# Patient Record
Sex: Female | Born: 1970 | Hispanic: No | Marital: Single | Smoking: Former smoker
Health system: Southern US, Community
[De-identification: ages and names within clinical notes are randomized; demographics above are authoritative.]

## PROBLEM LIST (undated history)

## (undated) DIAGNOSIS — F419 Anxiety disorder, unspecified: Secondary | ICD-10-CM

## (undated) DIAGNOSIS — I10 Essential (primary) hypertension: Secondary | ICD-10-CM

## (undated) DIAGNOSIS — F329 Major depressive disorder, single episode, unspecified: Secondary | ICD-10-CM

## (undated) DIAGNOSIS — F319 Bipolar disorder, unspecified: Secondary | ICD-10-CM

## (undated) DIAGNOSIS — F32A Depression, unspecified: Secondary | ICD-10-CM

## (undated) DIAGNOSIS — R569 Unspecified convulsions: Secondary | ICD-10-CM

## (undated) DIAGNOSIS — F209 Schizophrenia, unspecified: Secondary | ICD-10-CM

## (undated) HISTORY — PX: ABLATION: SHX5711

---

## 2017-01-24 ENCOUNTER — Encounter (HOSPITAL_COMMUNITY): Payer: Self-pay | Admitting: Emergency Medicine

## 2017-01-24 ENCOUNTER — Emergency Department (HOSPITAL_COMMUNITY)
Admission: EM | Admit: 2017-01-24 | Discharge: 2017-01-25 | Disposition: A | Discharge status: Home / Self Care | Payer: Medicaid Other | Attending: Emergency Medicine | Admitting: Emergency Medicine

## 2017-01-24 DIAGNOSIS — I10 Essential (primary) hypertension: Secondary | ICD-10-CM | POA: Insufficient documentation

## 2017-01-24 DIAGNOSIS — Z9114 Patient's other noncompliance with medication regimen: Secondary | ICD-10-CM | POA: Diagnosis not present

## 2017-01-24 DIAGNOSIS — Z87891 Personal history of nicotine dependence: Secondary | ICD-10-CM | POA: Diagnosis not present

## 2017-01-24 DIAGNOSIS — R569 Unspecified convulsions: Secondary | ICD-10-CM | POA: Insufficient documentation

## 2017-01-24 HISTORY — DX: Unspecified convulsions: R56.9

## 2017-01-24 HISTORY — DX: Essential (primary) hypertension: I10

## 2017-01-24 LAB — I-STAT CHEM 8, ED
BUN: 13 mg/dL (ref 6–20)
CALCIUM ION: 1.13 mmol/L — AB (ref 1.15–1.40)
CHLORIDE: 107 mmol/L (ref 101–111)
Creatinine, Ser: 0.8 mg/dL (ref 0.44–1.00)
GLUCOSE: 89 mg/dL (ref 65–99)
HCT: 39 % (ref 36.0–46.0)
Hemoglobin: 13.3 g/dL (ref 12.0–15.0)
POTASSIUM: 4 mmol/L (ref 3.5–5.1)
Sodium: 141 mmol/L (ref 135–145)
TCO2: 23 mmol/L (ref 0–100)

## 2017-01-24 LAB — PHENYTOIN LEVEL, TOTAL

## 2017-01-24 LAB — CBG MONITORING, ED: GLUCOSE-CAPILLARY: 108 mg/dL — AB (ref 65–99)

## 2017-01-24 MED ORDER — IBUPROFEN 200 MG PO TABS
600.0000 mg | ORAL_TABLET | Freq: Once | ORAL | Status: AC
  Administered 2017-01-24: 600 mg via ORAL
  Filled 2017-01-24: qty 3

## 2017-01-24 MED ORDER — PHENYTOIN SODIUM EXTENDED 100 MG PO CAPS: 100.0000 mg | ORAL_CAPSULE | Freq: Two times a day (BID) | ORAL | 0 refills | Status: DC

## 2017-01-24 MED ORDER — SODIUM CHLORIDE 0.9 % IV SOLN
1000.0000 mg | Freq: Once | INTRAVENOUS | Status: AC
  Administered 2017-01-24: 1000 mg via INTRAVENOUS
  Filled 2017-01-24: qty 20

## 2017-01-24 MED ORDER — SODIUM CHLORIDE 0.9 % IV BOLUS (SEPSIS)
1000.0000 mL | Freq: Once | INTRAVENOUS | Status: AC
  Administered 2017-01-24: 1000 mL via INTRAVENOUS

## 2017-01-24 MED ORDER — LORAZEPAM 2 MG/ML IJ SOLN
INTRAMUSCULAR | Status: AC
  Administered 2017-01-24: 1 mg
  Filled 2017-01-24: qty 1

## 2017-01-24 NOTE — ED Notes (Signed)
Pt's gait weak and staggering, states that she is still 'groggy'.  Both RN Florentina AddisonKatie and I (RN Catering manager(Liza) do not feel comfortable sending pt home in bus.  Pt states she doesn't have money for a cab and no one can come get her.  [facility] will not take her d/t her ability to walk.  Discussed with charge Camelia Engerri, social work consult placed.  Pt moved to TCU for further evaluation.

## 2017-01-24 NOTE — ED Triage Notes (Signed)
Per [facility] pt ran out of seizure medication yesterday. Had seizure today lasting about 3 mins. Pt requesting refill til she can see PCP on Monday.

## 2017-01-24 NOTE — ED Notes (Signed)
Bed: WA02 Expected date:  Expected time:  Means of arrival:  Comments: Seizure, med refill

## 2017-01-24 NOTE — ED Notes (Signed)
RN heard monitor alarming for about 15 seconds then came into room to find pt unresponsive to voice or sternal rub and drooling excessively.  HR was in 140s and sats in upper 90s.  Called for help and started suction.  PA Misty StanleyLisa saw her, ordered 1 mg ativan and to start dilantin (already ordered and at bedside) immediately as well as fluids.  Estimated time of seizure 1 minute total, pt did not bite tongue or lose airway or harm herself in any way.  Pt currently alert and oriented, unaware that she had seizure, airway still intact, sats at 98%.  HR still about 130 after ativan started.  Seizure pads placed and PA alerted to continued elevated HR.

## 2017-01-24 NOTE — Discharge Instructions (Signed)
I have refilled your dilantin level.  It is important that you take this regularly. Follow-up with your doctor once you return home. Return here for new concerns.

## 2017-01-24 NOTE — ED Notes (Signed)
Pt currently A&O, states that she feels comfortable going home and would like a bus pass.  Only asking for pain medication for HA.  No other seizures have occurred.  HR back down around 100.  Md aware.

## 2017-01-24 NOTE — ED Provider Notes (Signed)
[facility]ENC[facility] DEPT Provider Note   CSN: [id number] Arrival date & time: 01/24/17  1754     History   Chief Complaint Chief Complaint  Patient presents with  . Seizures    HPI Katherine Morse is a 46 y.o. female.  The history is provided by the patient and medical records.  Seizures      46 year old female with history of hypertension and seizures, presenting to the ED after a seizure. Patient reports she is currently in [place] temporarily, [place]. States she ran out of her seizure medicine yesterday so has missed the last 3 doses. States she had a seizure today which lasted about 3 minutes. On arrival to ED, patient is back to baseline. States she feels fine. No recent illness or fever. She states she plans to return home within the next 30 days and would just like a refill of her seizure medication until she can return there. No other complaints at this time.  Patient takes dilantin 100mg  BID.  Past Medical History:  Diagnosis Date  . Hypertension   . Seizures (HCC)     There are no active problems to display for this patient.   Past Surgical History:  Procedure Laterality Date  . ABLATION    . CESAREAN SECTION      OB History    No data available       Home Medications    Prior to Admission medications   Not on File    Family History No family history on file.  Social History Social History  Substance Use Topics  . Smoking status: Former Games developermoker  . Smokeless tobacco: Not on file  . Alcohol use No     Allergies   Compazine [prochlorperazine edisylate]; Morphine and related; Penicillins; and Vistaril [hydroxyzine hcl]   Review of Systems Review of Systems  Neurological: Positive for seizures.  All other systems reviewed and are negative.    Physical Exam Updated Vital Signs BP 134/80 (BP Location: Right Arm)   Pulse 100   Temp 99.6 F (37.6 C) (Oral)   Resp 14   Ht 5\' 2"  (1.575 m)   SpO2 100%   Physical  Exam  Constitutional: She is oriented to person, place, and time. She appears well-developed and well-nourished.  Calm, cooperative, no distress  HENT:  Head: Normocephalic and atraumatic.  Mouth/Throat: Oropharynx is clear and moist.  Eyes: Conjunctivae and EOM are normal. Pupils are equal, round, and reactive to light.  Neck: Normal range of motion.  Cardiovascular: Normal rate, regular rhythm and normal heart sounds.   Pulmonary/Chest: Effort normal and breath sounds normal.  Abdominal: Soft. Bowel sounds are normal.  Musculoskeletal: Normal range of motion.  Neurological: She is alert and oriented to person, place, and time.  AAOx3, answering questions and following commands appropriately; equal strength UE and LE bilaterally; CN grossly intact; moves all extremities appropriately without ataxia; no focal neuro deficits or facial asymmetry appreciated  Skin: Skin is warm and dry.  Psychiatric: She has a normal mood and affect.  Nursing note and vitals reviewed.    ED Treatments / Results  Labs (all labs ordered are listed, but only abnormal results are displayed) Labs Reviewed  PHENYTOIN LEVEL, TOTAL - Abnormal; Notable for the following:       Result Value   Phenytoin Lvl <2.5 (*)    All other components within normal limits  CBG MONITORING, ED - Abnormal; Notable for the following:    Glucose-Capillary 108 (*)  All other components within normal limits  I-STAT CHEM 8, ED - Abnormal; Notable for the following:    Calcium, Ion 1.13 (*)    All other components within normal limits    EKG  EKG Interpretation None       Radiology No results found.  Procedures Procedures (including critical care time)  Medications Ordered in ED Medications - No data to display   Initial Impression / Assessment and Plan / ED Course  I have reviewed the triage vital signs and the nursing notes.  Pertinent labs & imaging results that were available during my care of the patient  were reviewed by me and considered in my medical decision making (see chart for details).  46 year old female here after seizure. She has history of same. Reports she ran out of her Dilantin yesterday. Currently in [place] visiting from [place]. Patient is awake, alert, appropriately oriented on arrival. She denies any current complaints. She is neurologically intact. We'll plan for labs, EKG. Glucose 108.  Labwork with normal electrolytes. Dilantin level is <2.5.  Suspect patient has been out of her medication longer than stated.  Will load with IV dilantin.  8:06 PM Called back to patient's room.  RN entered room to hang dilantin and found patient breathing heavily, rapid HR, foaming at mouth with recurrent seizure.  This was brief, lasted <1 minute.  Patient was given 1mg  ativan now.  Dilantin load infusing now.  IVFB given as well.  Recurrent seizure likely due medication non-compliance.  Will monitor here.  8:29 PM Patient back to baseline.  Currently on phone talking with family members.  Requesting food to eat.  Given meal and fluids.  9:58 PM Patient has been eating and drinking here.  No further seizure activity.  Neurologically intact.  She feels comfortable going home.  Will refill her dilantin for 1 month.  Encouraged to follow-up with PCP as soon as she returns to [place].  Discussed plan with patient, she acknowledged understanding and agreed with plan of care.  Return precautions given for new or worsening symptoms.  Final Clinical Impressions(s) / ED Diagnoses   Final diagnoses:  Seizure (HCC)  Non compliance w medication regimen    New Prescriptions New Prescriptions   PHENYTOIN (DILANTIN) 100 MG ER CAPSULE    Take 1 capsule (100 mg total) by mouth 2 (two) times daily.     Garlon Hatchet, PA-C 01/24/17 2219    Lorre Nick, MD 01/28/17 [phone]

## 2017-01-25 NOTE — Care Management Note (Signed)
Case Management Note  Patient Details  Name: Katherine Morse MRN: [id number] Date of Birth: 1971-07-01  Subjective/Objective:    Seizures                 Action/Plan: Discharge Planning: AVS reviewed: NCM spoke to pt and provided her with facesheet to take with her to pharmacy to have her medication filled. States she does not have a copy of her medicaid card. She will contact her pharmacy to have medications transferred to a pharmacy closer to [place] in [place].   Expected Discharge Date:  01/25/2017               Expected Discharge Plan:  Home/Self Care  In-House Referral:  NA  Discharge planning Services  CM Consult, Medication Assistance  Post Acute Care Choice:  NA Choice offered to:  NA  DME Arranged:  N/A DME Agency:  NA  HH Arranged:  NA HH Agency:  NA  Status of Service:  Completed, signed off  If discussed at Long Length of Stay Meetings, dates discussed:    Additional Comments:  Katherine Morse, Katherine Tift Ellen, RN 01/25/2017, 11:22 AM

## 2017-01-27 ENCOUNTER — Telehealth: Payer: Self-pay | Admitting: *Deleted

## 2017-02-05 ENCOUNTER — Encounter (HOSPITAL_COMMUNITY): Payer: Self-pay | Admitting: Family Medicine

## 2017-02-05 ENCOUNTER — Emergency Department (HOSPITAL_COMMUNITY)
Admission: EM | Admit: 2017-02-05 | Discharge: 2017-02-06 | Disposition: A | Discharge status: Other Acute Inpatient Hospital | Payer: Medicaid Other | Attending: Emergency Medicine | Admitting: Emergency Medicine

## 2017-02-05 DIAGNOSIS — Z87891 Personal history of nicotine dependence: Secondary | ICD-10-CM | POA: Insufficient documentation

## 2017-02-05 DIAGNOSIS — F32A Depression, unspecified: Secondary | ICD-10-CM

## 2017-02-05 DIAGNOSIS — I10 Essential (primary) hypertension: Secondary | ICD-10-CM | POA: Insufficient documentation

## 2017-02-05 DIAGNOSIS — Z79899 Other long term (current) drug therapy: Secondary | ICD-10-CM | POA: Insufficient documentation

## 2017-02-05 DIAGNOSIS — F251 Schizoaffective disorder, depressive type: Secondary | ICD-10-CM | POA: Insufficient documentation

## 2017-02-05 DIAGNOSIS — F329 Major depressive disorder, single episode, unspecified: Secondary | ICD-10-CM

## 2017-02-05 HISTORY — DX: Bipolar disorder, unspecified: F31.9

## 2017-02-05 HISTORY — DX: Anxiety disorder, unspecified: F41.9

## 2017-02-05 HISTORY — DX: Major depressive disorder, single episode, unspecified: F32.9

## 2017-02-05 HISTORY — DX: Depression, unspecified: F32.A

## 2017-02-05 HISTORY — DX: Schizophrenia, unspecified: F20.9

## 2017-02-05 LAB — SALICYLATE LEVEL: Salicylate Lvl: <7 mg/dL (ref 2.8–30.0)

## 2017-02-05 LAB — ETHANOL

## 2017-02-05 LAB — CBC
HCT: 37.4 % (ref 36.0–46.0)
Hemoglobin: 12.1 g/dL (ref 12.0–15.0)
MCH: 28.5 pg (ref 26.0–34.0)
MCHC: 32.4 g/dL (ref 30.0–36.0)
MCV: 88 fL (ref 78.0–100.0)
PLATELETS: 269 10*3/uL (ref 150–400)
RBC: 4.25 MIL/uL (ref 3.87–5.11)
RDW: 14.8 % (ref 11.5–15.5)
WBC: 9.3 10*3/uL (ref 4.0–10.5)

## 2017-02-05 LAB — COMPREHENSIVE METABOLIC PANEL
ALBUMIN: 4.4 g/dL (ref 3.5–5.0)
ALT: 15 U/L (ref 14–54)
ANION GAP: 7 (ref 5–15)
AST: 22 U/L (ref 15–41)
Alkaline Phosphatase: 86 U/L (ref 38–126)
BILIRUBIN TOTAL: 0.3 mg/dL (ref 0.3–1.2)
BUN: 17 mg/dL (ref 6–20)
CO2: 24 mmol/L (ref 22–32)
Calcium: 9.3 mg/dL (ref 8.9–10.3)
Chloride: 110 mmol/L (ref 101–111)
Creatinine, Ser: 0.7 mg/dL (ref 0.44–1.00)
GFR calc Af Amer: >60 mL/min (ref >60)
GFR calc non Af Amer: >60 mL/min (ref >60)
Glucose, Bld: 98 mg/dL (ref 65–99)
POTASSIUM: 3.7 mmol/L (ref 3.5–5.1)
Sodium: 141 mmol/L (ref 135–145)
TOTAL PROTEIN: 8.3 g/dL — AB (ref 6.5–8.1)

## 2017-02-05 LAB — ACETAMINOPHEN LEVEL
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
## 2017-02-06 ENCOUNTER — Inpatient Hospital Stay (HOSPITAL_COMMUNITY)
Admission: AD | Admit: 2017-02-06 | Discharge: 2017-02-09 | DRG: 885 | Disposition: A | Discharge status: Other Acute Inpatient Hospital | Payer: Medicaid Other | Source: Intra-hospital | Attending: Internal Medicine | Admitting: Internal Medicine

## 2017-02-06 ENCOUNTER — Encounter (HOSPITAL_COMMUNITY): Payer: Self-pay

## 2017-02-06 DIAGNOSIS — Z885 Allergy status to narcotic agent status: Secondary | ICD-10-CM | POA: Diagnosis not present

## 2017-02-06 DIAGNOSIS — Z818 Family history of other mental and behavioral disorders: Secondary | ICD-10-CM | POA: Diagnosis not present

## 2017-02-06 DIAGNOSIS — Z888 Allergy status to other drugs, medicaments and biological substances status: Secondary | ICD-10-CM

## 2017-02-06 DIAGNOSIS — Z880 Allergy status to penicillin: Secondary | ICD-10-CM

## 2017-02-06 DIAGNOSIS — Z8249 Family history of ischemic heart disease and other diseases of the circulatory system: Secondary | ICD-10-CM

## 2017-02-06 DIAGNOSIS — R45851 Suicidal ideations: Secondary | ICD-10-CM | POA: Diagnosis present

## 2017-02-06 DIAGNOSIS — Z79899 Other long term (current) drug therapy: Secondary | ICD-10-CM | POA: Diagnosis not present

## 2017-02-06 DIAGNOSIS — F419 Anxiety disorder, unspecified: Secondary | ICD-10-CM | POA: Diagnosis present

## 2017-02-06 DIAGNOSIS — R45850 Homicidal ideations: Secondary | ICD-10-CM | POA: Diagnosis present

## 2017-02-06 DIAGNOSIS — A4189 Other specified sepsis: Secondary | ICD-10-CM | POA: Diagnosis present

## 2017-02-06 DIAGNOSIS — F319 Bipolar disorder, unspecified: Secondary | ICD-10-CM | POA: Diagnosis present

## 2017-02-06 DIAGNOSIS — I10 Essential (primary) hypertension: Secondary | ICD-10-CM | POA: Diagnosis present

## 2017-02-06 DIAGNOSIS — R509 Fever, unspecified: Secondary | ICD-10-CM

## 2017-02-06 DIAGNOSIS — Z87891 Personal history of nicotine dependence: Secondary | ICD-10-CM

## 2017-02-06 DIAGNOSIS — R569 Unspecified convulsions: Secondary | ICD-10-CM

## 2017-02-06 DIAGNOSIS — F251 Schizoaffective disorder, depressive type: Principal | ICD-10-CM | POA: Diagnosis present

## 2017-02-06 DIAGNOSIS — J101 Influenza due to other identified influenza virus with other respiratory manifestations: Secondary | ICD-10-CM | POA: Diagnosis not present

## 2017-02-06 DIAGNOSIS — G40909 Epilepsy, unspecified, not intractable, without status epilepticus: Secondary | ICD-10-CM | POA: Diagnosis present

## 2017-02-06 DIAGNOSIS — Z79891 Long term (current) use of opiate analgesic: Secondary | ICD-10-CM | POA: Diagnosis not present

## 2017-02-06 DIAGNOSIS — A419 Sepsis, unspecified organism: Secondary | ICD-10-CM | POA: Diagnosis present

## 2017-02-06 LAB — RAPID URINE DRUG SCREEN, HOSP PERFORMED
AMPHETAMINES: NOT DETECTED
BENZODIAZEPINES: NOT DETECTED
Barbiturates: NOT DETECTED
Cocaine: NOT DETECTED
OPIATES: NOT DETECTED
TETRAHYDROCANNABINOL: NOT DETECTED

## 2017-02-06 MED ORDER — PHENYTOIN SODIUM EXTENDED 100 MG PO CAPS
100.0000 mg | ORAL_CAPSULE | Freq: Two times a day (BID) | ORAL | Status: DC
  Administered 2017-02-06 – 2017-02-08 (×5): 100 mg via ORAL
  Filled 2017-02-06 (×9): qty 1

## 2017-02-06 MED ORDER — GABAPENTIN 300 MG PO CAPS
300.0000 mg | ORAL_CAPSULE | Freq: Three times a day (TID) | ORAL | Status: DC
  Administered 2017-02-06: 300 mg via ORAL
  Filled 2017-02-06: qty 1

## 2017-02-06 MED ORDER — METOPROLOL TARTRATE 25 MG PO TABS
25.0000 mg | ORAL_TABLET | Freq: Three times a day (TID) | ORAL | Status: DC
Start: 2017-02-06 — End: 2017-02-06
  Administered 2017-02-06: 25 mg via ORAL
  Filled 2017-02-06: qty 1

## 2017-02-06 MED ORDER — GABAPENTIN 300 MG PO CAPS
300.0000 mg | ORAL_CAPSULE | Freq: Three times a day (TID) | ORAL | Status: DC
  Administered 2017-02-06 – 2017-02-08 (×7): 300 mg via ORAL
  Filled 2017-02-06 (×12): qty 1

## 2017-02-06 MED ORDER — DULOXETINE HCL 30 MG PO CPEP
60.0000 mg | ORAL_CAPSULE | Freq: Every day | ORAL | Status: DC
  Administered 2017-02-07 – 2017-02-08 (×2): 60 mg via ORAL
  Filled 2017-02-06 (×4): qty 1

## 2017-02-06 MED ORDER — ACETAMINOPHEN 325 MG PO TABS
650.0000 mg | ORAL_TABLET | Freq: Four times a day (QID) | ORAL | Status: DC | PRN
  Administered 2017-02-07 (×2): 650 mg via ORAL
  Filled 2017-02-06 (×2): qty 2

## 2017-02-06 MED ORDER — PHENYTOIN SODIUM EXTENDED 100 MG PO CAPS
100.0000 mg | ORAL_CAPSULE | Freq: Two times a day (BID) | ORAL | Status: DC
  Filled 2017-02-06 (×2): qty 1

## 2017-02-06 MED ORDER — DULOXETINE HCL 30 MG PO CPEP
60.0000 mg | ORAL_CAPSULE | Freq: Every day | ORAL | Status: DC
  Administered 2017-02-06: 60 mg via ORAL
  Filled 2017-02-06: qty 2

## 2017-02-06 MED ORDER — PHENYTOIN SODIUM EXTENDED 100 MG PO CAPS
100.0000 mg | ORAL_CAPSULE | Freq: Two times a day (BID) | ORAL | Status: DC
  Administered 2017-02-06: 100 mg via ORAL
  Filled 2017-02-06: qty 1

## 2017-02-06 MED ORDER — ALUM & MAG HYDROXIDE-SIMETH 200-200-20 MG/5ML PO SUSP: 30.0000 mL | ORAL | Status: DC | PRN

## 2017-02-06 MED ORDER — LORAZEPAM 1 MG PO TABS
1.0000 mg | ORAL_TABLET | Freq: Three times a day (TID) | ORAL | Status: DC | PRN
  Administered 2017-02-06 (×2): 1 mg via ORAL
  Filled 2017-02-06 (×2): qty 1

## 2017-02-06 MED ORDER — PANTOPRAZOLE SODIUM 40 MG PO TBEC
40.0000 mg | DELAYED_RELEASE_TABLET | Freq: Every day | ORAL | Status: DC
  Administered 2017-02-06: 40 mg via ORAL
  Filled 2017-02-06: qty 1

## 2017-02-06 MED ORDER — ACETAMINOPHEN 325 MG PO TABS: 650.0000 mg | ORAL_TABLET | ORAL | Status: DC | PRN

## 2017-02-06 MED ORDER — METOPROLOL TARTRATE 25 MG PO TABS
25.0000 mg | ORAL_TABLET | Freq: Three times a day (TID) | ORAL | Status: DC
  Filled 2017-02-06 (×2): qty 1

## 2017-02-06 MED ORDER — METOPROLOL TARTRATE 25 MG PO TABS
25.0000 mg | ORAL_TABLET | Freq: Three times a day (TID) | ORAL | Status: DC
  Administered 2017-02-06 – 2017-02-07 (×4): 25 mg via ORAL
  Filled 2017-02-06 (×12): qty 1

## 2017-02-06 MED ORDER — ONDANSETRON HCL 4 MG PO TABS: 4.0000 mg | ORAL_TABLET | Freq: Three times a day (TID) | ORAL | Status: DC | PRN

## 2017-02-06 MED ORDER — MAGNESIUM HYDROXIDE 400 MG/5ML PO SUSP
30.0000 mL | Freq: Every day | ORAL | Status: DC | PRN
  Administered 2017-02-07: 30 mL via ORAL
  Filled 2017-02-06: qty 30

## 2017-02-06 MED ORDER — GABAPENTIN 300 MG PO CAPS
300.0000 mg | ORAL_CAPSULE | Freq: Three times a day (TID) | ORAL | Status: DC
Start: 2017-02-06 — End: 2017-02-06
  Filled 2017-02-06 (×2): qty 1

## 2017-02-06 MED ORDER — PANTOPRAZOLE SODIUM 40 MG PO TBEC
40.0000 mg | DELAYED_RELEASE_TABLET | Freq: Every day | ORAL | Status: DC
  Administered 2017-02-07 – 2017-02-08 (×2): 40 mg via ORAL
  Filled 2017-02-06 (×4): qty 1

## 2017-02-06 MED ORDER — IBUPROFEN 200 MG PO TABS
600.0000 mg | ORAL_TABLET | Freq: Three times a day (TID) | ORAL | Status: DC | PRN
  Administered 2017-02-06: 600 mg via ORAL
  Filled 2017-02-06: qty 3
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
## 2017-02-06 NOTE — ED Notes (Signed)
[facility] on unit to transfer pt to [facility] Adult unit per MD order. Pt signed for personal property and property given to [facility] for transfer. Pt signed e-signature. Ambulatory off unit. 
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
## 2017-02-06 NOTE — ED Notes (Signed)
Pt taking a shower 

## 2017-02-06 NOTE — Progress Notes (Signed)
02/06/17 1402:  LRT went to pt room to offer activities, pt was sleep.  Caroll RancherMarjette Ayce Pietrzyk, LRT/CTRS
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
## 2017-02-06 NOTE — ED Notes (Signed)
[facility] with [facility]. Patient currently denies HI and AVH. Patient states " I need somewhere to stay I want them to find me long term placement". Plan of care discussed. Encouragement and support provided and safety maintain. Q 15 min safety checks remain in place and video monitoring.

## 2017-02-06 NOTE — ED Provider Notes (Signed)
[facility]-EMERGENCY DEPT Provider Note   CSN: [id number] Arrival date & time: 02/05/17  2154  By signing my name below, I, [name], attest that this documentation has been prepared under the direction and in the presence of Geoffery Lyons, MD. Electronically Signed: [name], ED Scribe. 02/06/17. 12:25 AM.  History   Chief Complaint Chief Complaint  Patient presents with  . Psychiatric Evaluation   The history is provided by the patient and medical records. No language interpreter was used.  Illness  This is a new problem. The current episode started more than 2 days ago. The problem occurs constantly. The problem has been gradually worsening. Nothing aggravates the symptoms. Nothing relieves the symptoms. She has tried nothing for the symptoms.    HPI Comments: Katherine Morse is a 46 y.o. female BIB [facility], with a h/o depression, anxiety, schizophrenia, and Bipolar 1 disorder, who presents to the Emergency Department complaining of gradually worsening depression with associated suicidal ideation without noted plan beginning several weeks ago. Pt attributes that her current symptoms have been precipitated by and exacerbated with increased stressors in her life, including financial issues, issues in her living situation, and thinking about her significant other's and son's deaths. She notes that d/t her living situation with her roommate that she has had intermittent homicidal ideation as well, but without noted plan. Pt was taken off of her daily psychotropic medications two months ago by her PCP. She has otherwise been non-compliant with her medications daily. No EtOH or illicit drug usage otherwise. No recent illnesses/infections. Pt denies fever, or any other associated symptoms.   Past Medical History:  Diagnosis Date  . Anxiety   . Bipolar 1 disorder (HCC)   . Depression   . Hypertension   . Schizophrenia (HCC)   . Seizures (HCC)    There are no active problems to  display for this patient.  Past Surgical History:  Procedure Laterality Date  . ABLATION    . CESAREAN SECTION     OB History    No data available     Home Medications    Prior to Admission medications   Medication Sig Start Date End Date Taking? Authorizing Provider  DULoxetine (CYMBALTA) 60 MG capsule Take 60 mg by mouth daily.   Yes Historical Provider, MD  gabapentin (NEURONTIN) 300 MG capsule Take 300 mg by mouth 3 (three) times daily.   Yes Historical Provider, MD  linaclotide (LINZESS) 145 MCG CAPS capsule Take 145 mcg by mouth daily before breakfast.   Yes Historical Provider, MD  metoprolol tartrate (LOPRESSOR) 25 MG tablet Take 25 mg by mouth 3 (three) times daily.   Yes Historical Provider, MD  omeprazole (PRILOSEC) 20 MG capsule Take 20 mg by mouth daily.   Yes Historical Provider, MD  phenytoin (DILANTIN) 100 MG ER capsule Take 1 capsule (100 mg total) by mouth 2 (two) times daily. 01/24/17  Yes Garlon Hatchet, PA-C   Family History History reviewed. No pertinent family history.  Social History Social History  Substance Use Topics  . Smoking status: Former Games developer  . Smokeless tobacco: Never Used  . Alcohol use No   Allergies   Compazine [prochlorperazine edisylate]; Morphine and related; Penicillins; and Vistaril [hydroxyzine hcl]  Review of Systems Review of Systems  Constitutional: Negative for fever.  Psychiatric/Behavioral: Positive for suicidal ideas.  All other systems reviewed and are negative.  Physical Exam Updated Vital Signs Temp 98 F (36.7 C) (Oral)   Ht 5\' 2"  (1.575 m)  Wt 257 lb 9 oz (116.8 kg)   BMI 47.11 kg/m   Physical Exam  Constitutional: She is oriented to person, place, and time. She appears well-developed and well-nourished. No distress.  HENT:  Head: Normocephalic and atraumatic.  Eyes: EOM are normal.  Neck: Normal range of motion.  Cardiovascular: Normal rate, regular rhythm and normal heart sounds.   Pulmonary/Chest:  Effort normal and breath sounds normal.  Abdominal: Soft. She exhibits no distension. There is no tenderness.  Musculoskeletal: Normal range of motion.  Neurological: She is alert and oriented to person, place, and time.  Skin: Skin is warm and dry.  Psychiatric: Her affect is labile. Her speech is tangential. She is agitated. Cognition and memory are normal. She expresses impulsivity and inappropriate judgment. She exhibits a depressed mood. She expresses homicidal ideation.  Nursing note and vitals reviewed.  ED Treatments / Results  COORDINATION OF CARE: 12:24 AM-Discussed next steps with pt. Pt verbalized understanding and is agreeable with the plan.   Labs (all labs ordered are listed, but only abnormal results are displayed) Labs Reviewed  COMPREHENSIVE METABOLIC PANEL - Abnormal; Notable for the following:       Result Value   Total Protein 8.3 (*)    All other components within normal limits  ACETAMINOPHEN LEVEL - Abnormal; Notable for the following:    Acetaminophen (Tylenol), Serum <10 (*)    All other components within normal limits  ETHANOL  SALICYLATE LEVEL  CBC  RAPID URINE DRUG SCREEN, HOSP PERFORMED   EKG  EKG Interpretation None      Radiology No results found.  Procedures Procedures   Medications Ordered in ED Medications - No data to display  Initial Impression / Assessment and Plan / ED Course  I have reviewed the triage vital signs and the nursing notes.  Pertinent labs & imaging results that were available during my care of the patient were reviewed by me and considered in my medical decision making (see chart for details).     Patient reports suicidal ideation and depression resulting from financial and social issues. She will undergo evaluation by TTS to will determine the final disposition.  Final Clinical Impressions(s) / ED Diagnoses   Final diagnoses:  None   New Prescriptions New Prescriptions   No medications on file   I  personally performed the services described in this documentation, which was scribed in my presence. The recorded information has been reviewed and is accurate.       Geoffery Lyonsouglas Drew Lips, MD 02/06/17 [phone]

## 2017-02-06 NOTE — Tx Team (Signed)
Initial Treatment Plan 02/06/2017 7:13 PM Stuart Stevie KernBrayboy [id number]    PATIENT STRESSORS: Loss of boyfriend 6 years ago and 257 year old son passed away in 2005 Medication change or noncompliance Traumatic event   PATIENT STRENGTHS: Capable of independent living MetallurgistCommunication skills Financial means General fund of knowledge Motivation for treatment/growth   PATIENT IDENTIFIED PROBLEMS: Depression  Anxiety  Suicidal ideation  Psychosis  "Be able to not worry about things so much"  "Get my medicines stabilized"           DISCHARGE CRITERIA:  Improved stabilization in mood, thinking, and/or behavior Verbal commitment to aftercare and medication compliance  PRELIMINARY DISCHARGE PLAN: Outpatient therapy Medication management  PATIENT/FAMILY INVOLVEMENT: This treatment plan has been presented to and reviewed with the patient, Katherine Morse.  The patient and family have been given the opportunity to ask questions and make suggestions.  Katherine BaconHeather V Camdynn Maranto, RN 02/06/2017, 7:13 PM

## 2017-02-06 NOTE — Progress Notes (Signed)
Patient ID: Katherine Morse, female   DOB: 1971-04-23, 46 y.o.   MRN: [id number] D: client in bed most of the shift, up for medications. Client reports "I"m just tired" "I was living with a woman who just won't right, I don't have time for all that I'm 41forty-four years old" client did not elaborate just shaking her head. A: Writer provided emotional support encouraged client to share as she is ready. Medications reviewed, administered as ordered. R: Client is safe on the unit.

## 2017-02-06 NOTE — ED Notes (Signed)
Pt talking on hallway phone.  

## 2017-02-06 NOTE — BH Assessment (Signed)
[facility] Assessment Progress Note  Per Thedore MinsMojeed Akintayo, MD, this pt requires psychiatric hospitalization at this time.  Berneice Heinrichina Tate, RN, [id number].  Pt has signed Voluntary Admission and Consent for Treatment, as well as Consent to Release Information to [facility], her outpatient provider, and a notification call has been placed.  [facility].  Pt's nurse, Morrie Sheldonshley, has been notified, and agrees to send original paperwork along with pt via [facility] [place], and to call report to [phone].  Doylene Canninghomas Birtha Hatler, MA Triage Specialist [phone]

## 2017-02-06 NOTE — ED Notes (Signed)
Bed: [facility] Expected date:  Expected time:  Means of arrival:  Comments: 

## 2017-02-07 ENCOUNTER — Encounter (HOSPITAL_COMMUNITY): Payer: Self-pay | Admitting: Psychiatry

## 2017-02-07 DIAGNOSIS — R45850 Homicidal ideations: Secondary | ICD-10-CM

## 2017-02-07 DIAGNOSIS — I10 Essential (primary) hypertension: Secondary | ICD-10-CM | POA: Diagnosis present

## 2017-02-07 DIAGNOSIS — R45851 Suicidal ideations: Secondary | ICD-10-CM

## 2017-02-07 DIAGNOSIS — Z880 Allergy status to penicillin: Secondary | ICD-10-CM

## 2017-02-07 DIAGNOSIS — R569 Unspecified convulsions: Secondary | ICD-10-CM

## 2017-02-07 DIAGNOSIS — Z79899 Other long term (current) drug therapy: Secondary | ICD-10-CM

## 2017-02-07 DIAGNOSIS — Z888 Allergy status to other drugs, medicaments and biological substances status: Secondary | ICD-10-CM

## 2017-02-07 MED ORDER — OLANZAPINE 5 MG PO TBDP
5.0000 mg | ORAL_TABLET | Freq: Three times a day (TID) | ORAL | Status: DC | PRN
  Administered 2017-02-08: 5 mg via ORAL
  Filled 2017-02-07: qty 1

## 2017-02-07 MED ORDER — LIDOCAINE 5 % EX PTCH
1.0000 | MEDICATED_PATCH | CUTANEOUS | Status: DC
  Administered 2017-02-07 – 2017-02-08 (×2): 1 via TRANSDERMAL
  Filled 2017-02-07 (×3): qty 1

## 2017-02-07 MED ORDER — TRAZODONE HCL 100 MG PO TABS
100.0000 mg | ORAL_TABLET | Freq: Every day | ORAL | Status: DC
  Administered 2017-02-07 – 2017-02-08 (×2): 100 mg via ORAL
  Filled 2017-02-07 (×4): qty 1

## 2017-02-07 MED ORDER — ARIPIPRAZOLE 5 MG PO TABS
5.0000 mg | ORAL_TABLET | Freq: Every day | ORAL | Status: DC
  Administered 2017-02-07 – 2017-02-08 (×2): 5 mg via ORAL
  Filled 2017-02-07 (×4): qty 1

## 2017-02-07 MED ORDER — GUAIFENESIN ER 600 MG PO TB12
600.0000 mg | ORAL_TABLET | Freq: Two times a day (BID) | ORAL | Status: DC | PRN
  Administered 2017-02-07: 600 mg via ORAL
  Filled 2017-02-07: qty 1

## 2017-02-07 MED ORDER — POLYETHYLENE GLYCOL 3350 17 G PO PACK
17.0000 g | PACK | Freq: Every day | ORAL | Status: DC
  Administered 2017-02-07: 17 g via ORAL
  Filled 2017-02-07 (×4): qty 1

## 2017-02-07 MED ORDER — OLANZAPINE 10 MG IM SOLR: 5.0000 mg | Freq: Three times a day (TID) | INTRAMUSCULAR | Status: DC | PRN

## 2017-02-07 NOTE — [facility] Suicide Risk Assessment (Signed)
[facility] INPATIENT:  Family/Significant Other Suicide Prevention Education  Suicide Prevention Education:  Patient Refusal for Family/Significant Other Suicide Prevention Education: The patient Katherine Morse has refused to provide written consent for family/significant other to be provided Family/Significant Other Suicide Prevention Education during admission and/or prior to discharge.  Physician notified.  Patient states "I have no one that supports me, there's no one to talk to.   Sallee Langenne C Cunningham 02/07/2017, 2:34 PM

## 2017-02-07 NOTE — Progress Notes (Signed)
Recreation Therapy Notes  INPATIENT RECREATION THERAPY ASSESSMENT  Patient Details Name: Katherine Morse MRN: [id number] DOB: [birth year] Today's Date: 02/07/2017  Patient Stressors: Death, Other (Comment) (Roommate; anniversary of boyfriends death, son died)  Pt stated she was here because she got into an argument with her roommate.  Coping Skills:   Isolate, Avoidance, Self-Injury, Exercise, Talking, Music  Personal Challenges: Anger, Communication, Concentration, Decision-Making, Expressing Yourself, Problem-Solving, Relationships, Self-Esteem/Confidence, Social Interaction, Stress Management, Trusting Others  Leisure Interests (2+):  Social - Social Media, Exercise - Walking, Social - Family  Awareness of Community Resources:  No  Patient Strengths:  Feel God will bless me to get myself together, get back with family  Patient Identified Areas of Improvement:  My mouth, my trust, holding grudges  Current Recreation Participation:  Everyday  Patient Goal for Hospitalization:  "Keep taking meds, talk to doctor, get on right track and go to groups"  [place]  [place]  Current [place] (including self-harm):  No  Current HI:  No  Consent to Intern Participation: N/A  Caroll RancherMarjette Karalyn Kadel, LRT/CTRS  Lillia AbedLindsay, Nathyn Luiz A 02/07/2017, 2:11 PM

## 2017-02-07 NOTE — [facility] Suicide Risk Assessment (Signed)
[facility] Admission Suicide Risk Assessment   Nursing information obtained from:  Patient Demographic factors:  Divorced or widowed, Low socioeconomic status Current Mental Status:  Suicidal ideation indicated by patient Loss Factors:  Loss of significant relationship Historical Factors:  NA Risk Reduction Factors:  NA  Total Time spent with patient: 30 minutes Principal Problem: Schizoaffective disorder, depressive type (HCC) Diagnosis:   Patient Active Problem List   Diagnosis Date Noted  . Convulsions/seizures (HCC) [R56.9] 02/07/2017  . Essential hypertension [I10] 02/07/2017  . Schizoaffective disorder, depressive type (HCC) [F25.1] 02/06/2017   Subjective Data: Patient has been off her medications, and it is also the death anniversary of her son as well as an ex boyfriend . Pt reports worsening depression, psychosis, SI as well as sleep issues. Pt with poor social support , multiple medical issues.   Continued Clinical Symptoms:  Alcohol Use Disorder Identification Test Final Score (AUDIT): 0 The "Alcohol Use Disorders Identification Test", Guidelines for Use in Primary Care, Second Edition.  World Science writer [facility]). Score between 0-7:  no or low risk or alcohol related problems. Score between 8-15:  moderate risk of alcohol related problems. Score between 16-19:  high risk of alcohol related problems. Score 20 or above:  warrants further diagnostic evaluation for alcohol dependence and treatment.   CLINICAL FACTORS:   Depression:   Hopelessness Impulsivity Insomnia Unstable or Poor Therapeutic Relationship Previous Psychiatric Diagnoses and Treatments Medical Diagnoses and Treatments/Surgeries   Musculoskeletal: Strength & Muscle Tone: within normal limits Gait & Station: normal Patient leans: N/A  Psychiatric Specialty Exam: Physical Exam  Review of Systems  Psychiatric/Behavioral: Positive for depression and hallucinations. The patient is nervous/anxious and  has insomnia.   All other systems reviewed and are negative.   Blood pressure 109/84, pulse 100, temperature 98.5 F (36.9 C), temperature source Oral, resp. rate 18, height 5' 0.5" (1.537 m), weight 114.8 kg (253 lb).Body mass index is 48.6 kg/m.  General Appearance: Fairly Groomed  Eye Contact:  Fair  Speech:  Normal Rate  Volume:  Decreased  Mood:  Anxious, Depressed and Dysphoric  Affect:  Depressed  Thought Process:  Goal Directed and Descriptions of Associations: Circumstantial  Orientation:  Full (Time, Place, and Person)  Thought Content:  Hallucinations: Auditory Visual and Rumination- Voices of her son  Suicidal Thoughts:  Yes.  without intent/plan  Homicidal Thoughts:  No  Memory:  Immediate;   Fair Recent;   Fair Remote;   Fair  Judgement:  Impaired  Insight:  Shallow  Psychomotor Activity:  Normal  Concentration:  Concentration: Fair and Attention Span: Fair  Recall:  [employer] of Knowledge:  Fair  Language:  Fair  Akathisia:  No  Handed:  Right  AIMS (if indicated):     Assets:  Desire for Improvement  ADL's:  Intact  Cognition:  WNL  Sleep:  Number of Hours: 5.75      COGNITIVE FEATURES THAT CONTRIBUTE TO RISK:  Closed-mindedness, Polarized thinking and Thought constriction (tunnel vision)    SUICIDE RISK:   Moderate:  Frequent suicidal ideation with limited intensity, and duration, some specificity in terms of plans, no associated intent, good self-control, limited dysphoria/symptomatology, some risk factors present, and identifiable protective factors, including available and accessible social support.  PLAN OF CARE: Will continue Cymbalta, add Abilify, trazodone for psychosis/sleep . Will restart home medications. Please see H&P per NP.  I certify that inpatient services furnished can reasonably be expected to improve the patient's condition.   Jomarie Longs, MD  02/07/2017, 12:19 PM
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
## 2017-02-07 NOTE — Progress Notes (Addendum)
Acknowledging consult for grief / loss around death anniversary of son / boyfriend.     Katherine Morse shared about loss in spiritual care group on [address]  Stated she became boyfriend's caregiver during the later weeks of his life and that she feels remorse / guilt in wondering whether she did enough.  She is affirmed by his sisters and other family members in her care for him.    Described making decision to remove boyfriend from life support.  Additionally, stated that her care of self declined as she cared for her boyfriend.  Reported not taking meds during this period.    Chaplain offered care around loss in context of group topic (Care).  Will follow up for continued support around grief.   Katherine Morse requested prayers following group.   She stated she wanted to offer a prayer of hope for other group members described knowing God is present in their lives and is going to "make things happen for them."       [name], [name] MDiv

## 2017-02-07 NOTE — Clinical Social Work Note (Signed)
Referred to [facility], is [place] resident.  Anne Cunningham, LCSW Lead Clinical Social Worker Phone:  [phone]  

## 2017-02-07 NOTE — Tx Team (Signed)
Interdisciplinary Treatment and Diagnostic Plan Update  02/07/2017 Time of Session: 3:14 PM  Katherine Morse MRN: [id number]  Principal Diagnosis: Schizoaffective disorder, depressive type [facility])  Secondary Diagnoses: Principal Problem:   Schizoaffective disorder, depressive type ([place]) Active Problems:   Convulsions/seizures ([place])   Essential hypertension   Current Medications:  Current Facility-Administered Medications  Medication Dose Route Frequency Provider Last Rate Last Dose  . acetaminophen (TYLENOL) tablet 650 mg  650 mg Oral Q6H PRN Lurena Nida, NP   650 mg at 02/07/17 0759  . alum & mag hydroxide-simeth (MAALOX/MYLANTA) 200-200-20 MG/5ML suspension 30 mL  30 mL Oral Q4H PRN Lurena Nida, NP      . ARIPiprazole (ABILIFY) tablet 5 mg  5 mg Oral QHS Saramma Eappen, MD      . DULoxetine (CYMBALTA) DR capsule 60 mg  60 mg Oral Daily Lurena Nida, NP   60 mg at 02/07/17 0800  . gabapentin (NEURONTIN) capsule 300 mg  300 mg Oral TID Rozetta Nunnery, NP   300 mg at 02/07/17 1149  . lidocaine (LIDODERM) 5 % 1 patch  1 patch Transdermal Q24H Saramma Eappen, MD      . magnesium hydroxide (MILK OF MAGNESIA) suspension 30 mL  30 mL Oral Daily PRN Lurena Nida, NP   30 mL at 02/07/17 0759  . metoprolol tartrate (LOPRESSOR) tablet 25 mg  25 mg Oral TID Rozetta Nunnery, NP   25 mg at 02/07/17 1149  . OLANZapine zydis (ZYPREXA) disintegrating tablet 5 mg  5 mg Oral TID PRN Ursula Alert, MD       Or  . OLANZapine (ZYPREXA) injection 5 mg  5 mg Intramuscular TID PRN Ursula Alert, MD      . pantoprazole (PROTONIX) EC tablet 40 mg  40 mg Oral Daily Lurena Nida, NP   40 mg at 02/07/17 0800  . phenytoin (DILANTIN) ER capsule 100 mg  100 mg Oral BID Rozetta Nunnery, NP   100 mg at 02/07/17 0800  . polyethylene glycol (MIRALAX / GLYCOLAX) packet 17 g  17 g Oral Daily Saramma Eappen, MD      . traZODone (DESYREL) tablet 100 mg  100 mg Oral QHS Ursula Alert, MD        PTA  Medications: Prescriptions Prior to Admission  Medication Sig Dispense Refill Last Dose  . amitriptyline (ELAVIL) 50 MG tablet Take by mouth.     . diclofenac (VOLTAREN) 75 MG EC tablet Take 75 mg by mouth.     . DULoxetine (CYMBALTA) 60 MG capsule Take 60 mg by mouth daily.   Past Week at Unknown time  . DULoxetine (CYMBALTA) 60 MG capsule TAKE ONE CAPSULE BY MOUTH TWICE DAILY     . famotidine (PEPCID) 20 MG tablet Take 20 mg by mouth.     . gabapentin (NEURONTIN) 100 MG capsule Take 100 mg by mouth.     . gabapentin (NEURONTIN) 300 MG capsule Take 300 mg by mouth 3 (three) times daily.   Past Week at Unknown time  . ibuprofen (ADVIL,MOTRIN) 800 MG tablet Take 800 mg by mouth.     . linaclotide (LINZESS) 145 MCG CAPS capsule Take 145 mcg by mouth daily before breakfast.   Past Week at Unknown time  . methocarbamol (ROBAXIN) 500 MG tablet Take 500-1,000 mg by mouth.     . metoprolol tartrate (LOPRESSOR) 25 MG tablet Take 25 mg by mouth 3 (three) times daily.   Past Month at Unknown time  .  metoprolol tartrate (LOPRESSOR) 25 MG tablet Take 25 mg by mouth.     Marland Kitchen omeprazole (PRILOSEC) 20 MG capsule Take 20 mg by mouth daily.   Past Week at Unknown time  . omeprazole (PRILOSEC) 20 MG capsule Take by mouth.     . oxyCODONE-acetaminophen (PERCOCET) 10-325 MG tablet Take by mouth.     . oxyCODONE-acetaminophen (PERCOCET) 10-325 MG tablet Take by mouth.     . phenytoin (DILANTIN) 100 MG ER capsule Take 1 capsule (100 mg total) by mouth 2 (two) times daily. 60 capsule 0 Past Month at Unknown time  . phenytoin (DILANTIN) 100 MG ER capsule TAKE THREE CAPSULES BY MOUTH EVERY MORNING THEN TAKE FOUR CAPSULES BY MOUTH EVERY EVENING     . QUEtiapine (SEROQUEL) 100 MG tablet Take 100 mg by mouth.     . QUEtiapine (SEROQUEL) 300 MG tablet TAKE ONE TABLET BY MOUTH EVERY NIGHT AT BEDTIME       Treatment Modalities: Medication Management, Group therapy, Case management,  1 to 1 session with clinician,  Psychoeducation, Recreational therapy.   Physician Treatment Plan for Primary Diagnosis: Schizoaffective disorder, depressive type ([place]) Long Term Goal(s): Improvement in symptoms so as ready for discharge  Short Term Goals: Ability to verbalize feelings will improve  Medication Management: Evaluate patient's response, side effects, and tolerance of medication regimen.  Therapeutic Interventions: 1 to 1 sessions, Unit Group sessions and Medication administration.  Evaluation of Outcomes: Not Met  Physician Treatment Plan for Secondary Diagnosis: Principal Problem:   Schizoaffective disorder, depressive type ([place]) Active Problems:   Convulsions/seizures ([place])   Essential hypertension   Long Term Goal(s): Improvement in symptoms so as ready for discharge  Short Term Goals: Ability to demonstrate self-control will improve  Medication Management: Evaluate patient's response, side effects, and tolerance of medication regimen.  Therapeutic Interventions: 1 to 1 sessions, Unit Group sessions and Medication administration.  Evaluation of Outcomes: Not Met   RN Treatment Plan for Primary Diagnosis: Schizoaffective disorder, depressive type ([place]) Long Term Goal(s): Knowledge of disease and therapeutic regimen to maintain health will improve  Short Term Goals: Ability to disclose and discuss suicidal ideas and Compliance with prescribed medications will improve  Medication Management: RN will administer medications as ordered by provider, will assess and evaluate patient's response and provide education to patient for prescribed medication. RN will report any adverse and/or side effects to prescribing provider.  Therapeutic Interventions: 1 on 1 counseling sessions, Psychoeducation, Medication administration, Evaluate responses to treatment, Monitor vital signs and CBGs as ordered, Perform/monitor CIWA, COWS, AIMS and Fall Risk screenings as ordered, Perform wound care treatments as  ordered.  Evaluation of Outcomes: Not Met   LCSW Treatment Plan for Primary Diagnosis: Schizoaffective disorder, depressive type ([place]) Long Term Goal(s): Safe transition to appropriate next level of care at discharge, Engage patient in therapeutic group addressing interpersonal concerns.  Short Term Goals: Engage patient in aftercare planning with referrals and resources and Increase skills for wellness and recovery  Therapeutic Interventions: Assess for all discharge needs, 1 to 1 time with Social worker, Explore available resources and support systems, Assess for adequacy in community support network, Educate family and significant other(s) on suicide prevention, Complete Psychosocial Assessment, Interpersonal group therapy.  Evaluation of Outcomes: Not Met   Progress in Treatment: Attending groups: Yes Participating in groups: Yes Taking medication as prescribed: Yes, MD continues to assess for medication changes as needed Toleration medication: Yes, no side effects reported at this time Family/Significant other contact made: No, patient  declined  Patient understands diagnosis: Limited insight  Discussing patient identified problems/goals with staff: Yes Medical problems stabilized or resolved: Yes Denies suicidal/homicidal ideation: No  Issues/concerns per patient self-inventory: None Other: N/A  New problem(s) identified: None identified at this time.   New Short Term/Long Term Goal(s): None identified at this time.   Discharge Plan or Barriers: Follow up with [facility]  Reason for Continuation of Hospitalization: Anxiety  Depression Hallucinations Homicidal ideations Medication stabilization Suicidal ideation   Estimated Length of Stay: 3-5 days  Attendees: Patient: 02/07/2017  3:14 PM  Physician: Dr. Shea Evans 02/07/2017  3:14 PM  Nursing: Jolayne Haines, RN  02/07/2017  3:14 PM  RN Care Manager: Lars Pinks 02/07/2017  3:14 PM  Social Worker: Ripley Fraise, [place] 02/07/2017  3:14 PM   Recreational Therapist: Winfield Cunas 02/07/2017  3:14 PM  Other: Radonna Ricker, Social Work Intern  02/07/2017  3:14 PM  Other:  02/07/2017  3:14 PM  Other: 02/07/2017  3:14 PM    Scribe for Treatment Team: Radonna Ricker, Social Work Intern 02/07/2017 3:14 PM

## 2017-02-07 NOTE — Progress Notes (Signed)
Adult Psychoeducational Group Note  Date:  02/07/2017 Time:  9:11 PM  Group Topic/Focus:  Wrap-Up Group:   The focus of this group is to help patients review their daily goal of treatment and discuss progress on daily workbooks.  Participation Level:  Active  Participation Quality:  Appropriate  Affect:  Appropriate  Cognitive:  Appropriate  Insight: Appropriate  Engagement in Group:  Engaged  Modes of Intervention:  Discussion  Additional Comments: The patient expressed that she attended groups.The patient also said that she rates today a 8. Octavio Mannshigpen, Sascha Baugher Lee 02/07/2017, 9:11 PM
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
## 2017-02-07 NOTE — Progress Notes (Signed)
Recreation Therapy Notes  Date: 02/07/17 Time: 1000 Location: 500 Hall Dayroom  Group Topic: Communication, Team Building, Problem Solving  Goal Area(s) Addresses:  Patient will effectively work with peer towards shared goal.  Patient will identify skill used to make activity successful.  Patient will identify how skills used during activity can be used to reach post d/c goals.   Behavioral Response: Minimal  Intervention: STEM Activity   Activity: [employer]away[employer]ipe Cleaner Tower. In teams, patients were asked to build the tallest freestanding tower possible out of 15 pipe cleaners. Systematically resources were removed, for example patient ability to use both hands and patient ability to verbally communicate.    Education:Social Skills, Discharge Planning.   Education Outcome: Acknowledges education/In group clarification offered/Needs additional education.   Clinical Observations/Feedback: Pt arrived late during processing.  Pt stated she doesn't really have a support system and she is not from here.  She talked about having two kids in jail and she got off her medications.  Pt stated she depends on God for support.   Katherine RancherMarjette Nguyen Morse, LRT/CTRS        Katherine RancherLindsay, Katherine Morse A 02/07/2017 12:52 PM

## 2017-02-07 NOTE — Progress Notes (Signed)
Patient ID: Katherine Morse, female   DOB: [birth year], 46 y.o.   MRN: [id number] D: Client spends most of the day in bed. Client complains "I think I coming down with a cold" "I been coughing" A: Writer provided emotional support, staffed with Baron HamperJason B. FNP. Received new order for Mucinex (see MAR). Medications reviewed, administered as ordered. 02 Sat 99 %. Staff will monitor q8215min for safety. R: Client is safe on the unit, attended group.

## 2017-02-07 NOTE — Plan of Care (Signed)
Problem: Safety: Goal: Periods of time without injury will increase Outcome: Progressing Periods of time without injury will increase AEB q7715min safety checks and verbalcontract for safety.

## 2017-02-08 ENCOUNTER — Inpatient Hospital Stay (HOSPITAL_COMMUNITY): Payer: Medicaid Other

## 2017-02-08 ENCOUNTER — Encounter (HOSPITAL_COMMUNITY): Payer: Self-pay | Admitting: Emergency Medicine

## 2017-02-08 DIAGNOSIS — A419 Sepsis, unspecified organism: Secondary | ICD-10-CM

## 2017-02-08 DIAGNOSIS — R569 Unspecified convulsions: Secondary | ICD-10-CM

## 2017-02-08 DIAGNOSIS — Z87891 Personal history of nicotine dependence: Secondary | ICD-10-CM

## 2017-02-08 DIAGNOSIS — Z8249 Family history of ischemic heart disease and other diseases of the circulatory system: Secondary | ICD-10-CM

## 2017-02-08 DIAGNOSIS — I10 Essential (primary) hypertension: Secondary | ICD-10-CM

## 2017-02-08 DIAGNOSIS — J101 Influenza due to other identified influenza virus with other respiratory manifestations: Secondary | ICD-10-CM | POA: Diagnosis present

## 2017-02-08 DIAGNOSIS — Z818 Family history of other mental and behavioral disorders: Secondary | ICD-10-CM

## 2017-02-08 DIAGNOSIS — F251 Schizoaffective disorder, depressive type: Principal | ICD-10-CM

## 2017-02-08 LAB — CBC WITH DIFFERENTIAL/PLATELET
Basophils Absolute: 0 10*3/uL (ref 0.0–0.1)
Basophils Relative: 0 %
Eosinophils Absolute: 0 10*3/uL (ref 0.0–0.7)
Eosinophils Relative: 0 %
HCT: 37.8 % (ref 36.0–46.0)
Hemoglobin: 12.5 g/dL (ref 12.0–15.0)
Lymphocytes Relative: 5 %
Lymphs Abs: 0.4 10*3/uL — ABNORMAL LOW (ref 0.7–4.0)
MCH: 28.3 pg (ref 26.0–34.0)
MCHC: 33.1 g/dL (ref 30.0–36.0)
MCV: 85.5 fL (ref 78.0–100.0)
Monocytes Absolute: 0.4 10*3/uL (ref 0.1–1.0)
Monocytes Relative: 5 %
Neutro Abs: 7.4 10*3/uL (ref 1.7–7.7)
Neutrophils Relative %: 90 %
Platelets: 221 10*3/uL (ref 150–400)
RBC: 4.42 MIL/uL (ref 3.87–5.11)
RDW: 14.2 % (ref 11.5–15.5)
WBC: 8.2 10*3/uL (ref 4.0–10.5)

## 2017-02-08 LAB — COMPREHENSIVE METABOLIC PANEL
ALT: 15 U/L (ref 14–54)
AST: 20 U/L (ref 15–41)
Albumin: 4.2 g/dL (ref 3.5–5.0)
Alkaline Phosphatase: 84 U/L (ref 38–126)
Anion gap: 8 (ref 5–15)
BUN: 18 mg/dL (ref 6–20)
CO2: 23 mmol/L (ref 22–32)
Calcium: 9.2 mg/dL (ref 8.9–10.3)
Chloride: 103 mmol/L (ref 101–111)
Creatinine, Ser: 0.68 mg/dL (ref 0.44–1.00)
GFR calc Af Amer: >60 mL/min (ref >60)
GFR calc non Af Amer: >60 mL/min (ref >60)
Glucose, Bld: 91 mg/dL (ref 65–99)
Potassium: 3.6 mmol/L (ref 3.5–5.1)
Sodium: 134 mmol/L — ABNORMAL LOW (ref 135–145)
Total Bilirubin: 0.7 mg/dL (ref 0.3–1.2)
Total Protein: 8.2 g/dL — ABNORMAL HIGH (ref 6.5–8.1)

## 2017-02-08 LAB — INFLUENZA PANEL BY PCR (TYPE A & B)
INFLAPCR: POSITIVE — AB
INFLBPCR: NEGATIVE

## 2017-02-08 LAB — PREGNANCY, URINE: Preg Test, Ur: NEGATIVE

## 2017-02-08 LAB — LIPID PANEL
CHOL/HDL RATIO: 3.3 ratio
CHOLESTEROL: 206 mg/dL — AB (ref 0–200)
HDL: 63 mg/dL (ref >40)
LDL Cholesterol: 126 mg/dL — ABNORMAL HIGH (ref 0–99)
Triglycerides: 84 mg/dL (ref <150)
VLDL: 17 mg/dL (ref 0–40)

## 2017-02-08 LAB — TSH: TSH: 0.779 u[IU]/mL (ref 0.350–4.500)

## 2017-02-08 LAB — I-STAT CG4 LACTIC ACID, ED: Lactic Acid, Venous: 0.92 mmol/L (ref 0.5–1.9)

## 2017-02-08 MED ORDER — SODIUM CHLORIDE 0.9 % IV BOLUS (SEPSIS)
1000.0000 mL | Freq: Once | INTRAVENOUS | Status: AC
  Administered 2017-02-08: 1000 mL via INTRAVENOUS

## 2017-02-08 MED ORDER — SODIUM CHLORIDE 0.9 % IV BOLUS (SEPSIS)
2000.0000 mL | Freq: Once | INTRAVENOUS | Status: AC
  Administered 2017-02-08: 2000 mL via INTRAVENOUS

## 2017-02-08 MED ORDER — BENZONATATE 100 MG PO CAPS
100.0000 mg | ORAL_CAPSULE | Freq: Three times a day (TID) | ORAL | Status: DC
  Administered 2017-02-08 (×2): 100 mg via ORAL
  Filled 2017-02-08 (×4): qty 1

## 2017-02-08 MED ORDER — OSELTAMIVIR PHOSPHATE 75 MG PO CAPS
75.0000 mg | ORAL_CAPSULE | Freq: Once | ORAL | Status: AC
  Administered 2017-02-08: 75 mg via ORAL
  Filled 2017-02-08: qty 1

## 2017-02-08 MED ORDER — PIPERACILLIN-TAZOBACTAM 3.375 G IVPB
3.3750 g | Freq: Once | INTRAVENOUS | Status: AC
  Administered 2017-02-08: 3.375 g via INTRAVENOUS
  Filled 2017-02-08: qty 50

## 2017-02-08 MED ORDER — GUAIFENESIN ER 600 MG PO TB12
600.0000 mg | ORAL_TABLET | Freq: Once | ORAL | Status: AC
  Administered 2017-02-08: 600 mg via ORAL
  Filled 2017-02-08 (×2): qty 1

## 2017-02-08 MED ORDER — OSELTAMIVIR PHOSPHATE 75 MG PO CAPS: 75.0000 mg | ORAL_CAPSULE | Freq: Two times a day (BID) | ORAL | Status: DC

## 2017-02-08 MED ORDER — SALINE SPRAY 0.65 % NA SOLN: 1.0000 | NASAL | Status: DC | PRN

## 2017-02-08 MED ORDER — GUAIFENESIN ER 600 MG PO TB12
600.0000 mg | ORAL_TABLET | Freq: Two times a day (BID) | ORAL | Status: DC
  Filled 2017-02-08: qty 1

## 2017-02-08 MED ORDER — ACETAMINOPHEN 325 MG PO TABS
650.0000 mg | ORAL_TABLET | Freq: Once | ORAL | Status: AC
  Administered 2017-02-08: 650 mg via ORAL
  Filled 2017-02-08: qty 2

## 2017-02-08 MED ORDER — GUAIFENESIN ER 600 MG PO TB12
600.0000 mg | ORAL_TABLET | Freq: Two times a day (BID) | ORAL | Status: DC | PRN
Start: 2017-02-12 — End: 2017-02-09

## 2017-02-08 MED ORDER — VANCOMYCIN HCL IN DEXTROSE 1-5 GM/200ML-% IV SOLN
1000.0000 mg | Freq: Once | INTRAVENOUS | Status: AC
  Administered 2017-02-08: 1000 mg via INTRAVENOUS
  Filled 2017-02-08: qty 200

## 2017-02-08 MED ORDER — GUAIFENESIN ER 600 MG PO TB12: 600.0000 mg | ORAL_TABLET | Freq: Two times a day (BID) | ORAL | Status: DC

## 2017-02-08 MED ORDER — PSEUDOEPHEDRINE HCL 60 MG PO TABS
60.0000 mg | ORAL_TABLET | Freq: Once | ORAL | Status: AC
  Administered 2017-02-08: 60 mg via ORAL
  Filled 2017-02-08: qty 1

## 2017-02-08 NOTE — ED Notes (Signed)
Vancomycin delayed d/t loss of IV access.

## 2017-02-08 NOTE — ED Triage Notes (Signed)
Per EMS, [facility] and is [facility] for [facility]. Facility was worried about her blood pressure, which was 88/58 with EMS.  Upon assessment, BP is 121/63.  Pt was given tylenol this morning for a fever, which brought it down a little.  Pt is alert and oriented, and ambulatory.

## 2017-02-08 NOTE — Progress Notes (Signed)
Patient ID: Katherine Morse, female   DOB: May 31, 1971, 46 y.o.   MRN: [id number]   Pts vitals at dinner time was BP 80/36 HR 108 Temp 99.5 Resp 20 sitting, and standing was BP 89/56 HR 128 Resp 22. Tanika NP made aware orders noted to [facility] as patient is not eating or drinking. Patient was also tested for the flu, with positive results. Pt was also complaining of nausea and fatigue. EMS was called.

## 2017-02-08 NOTE — [facility] Group Notes (Signed)
[facility] Group Notes:  (Nursing/MHT/Case Management/Adjunct)  Date:  02/08/2017  Time:  11:59 AM  Type of Therapy:  Psychoeducational Skills  Participation Level:  Did Not Attend  Participation Quality:  Did Not Attend  Affect:  Did Not Attend  Cognitive:  Did Not Attend  Insight:  None  Engagement in Group:  Did Not Attend  Modes of Intervention:  Did Not Attend  Summary of Progress/Problems: Pt did not attend patient self inventory group.   Katherine Morse Shanta 02/08/2017, 11:59 AM 

## 2017-02-08 NOTE — Progress Notes (Signed)
[facility] MD Progress Note  02/08/2017 2:53 PM Katherine Morse  MRN:  [id number] Subjective:  I am having cold symptoms.  I could not sleep last night.  I still feel depressed and continued to hear voices.  Objective; Patient seen chart reviewed.  Patient is 46 year old female who was admitted due to increased depression having suicidal thoughts, paranoia and not feeling well.  Patient has multiple health issues including seizures.  Her last seizure was one week ago.  Today patient complaining of cold symptoms.  She is having body aches and fever. She endorsed poor sleep and racing thoughts.she continued to endorse suicidal thoughts but denies any plan.  She is isolated and withdrawn and stays to herself.  She admitted due to have homicidal thoughts towards her roommate but no plan or any intent.  She is taking Abilify and reported no side effects.  She has no tremors or shakes.  Principal Problem: Schizoaffective disorder, depressive type (HCC) Diagnosis:   Patient Active Problem List   Diagnosis Date Noted  . Convulsions/seizures (HCC) [R56.9] 02/07/2017  . Essential hypertension [I10] 02/07/2017  . Schizoaffective disorder, depressive type (HCC) [F25.1] 02/06/2017   Total Time spent with patient: 20 minutes  Past Psychiatric History: reviewed.  Past Medical History:  Past Medical History:  Diagnosis Date  . Anxiety   . Bipolar 1 disorder (HCC)   . Depression   . Hypertension   . Schizophrenia (HCC)   . Seizures (HCC)     Past Surgical History:  Procedure Laterality Date  . ABLATION    . CESAREAN SECTION     Family History:  Family History  Problem Relation Age of Onset  . Hypertension Mother   . Mental illness Son    Family Psychiatric  History: reviewed. Social History:  History  Alcohol Use No     History  Drug Use No    Social History   Social History  . Marital status: Single    Spouse name: N/A  . Number of children: N/A  . Years of education: N/A   Social  History Main Topics  . Smoking status: Former Games developer  . Smokeless tobacco: Never Used  . Alcohol use No  . Drug use: No  . Sexual activity: Not Asked   Other Topics Concern  . None   Social History Narrative  . None   Additional Social History:    Pain Medications: See PTA medication list Prescriptions: See PTA medication list, Dilantin, Cymbalta and one that she has not gotten filled yet. Topolol, Gabapentin, Lynsess, Prilosec Over the Counter: See PTA medication list History of alcohol / drug use?: No history of alcohol / drug abuse                    Sleep: Poor  Appetite:  Poor  Current Medications: Current Facility-Administered Medications  Medication Dose Route Frequency Provider Last Rate Last Dose  . acetaminophen (TYLENOL) tablet 650 mg  650 mg Oral Q6H PRN Kristeen Mans, NP   650 mg at 02/07/17 2127  . alum & mag hydroxide-simeth (MAALOX/MYLANTA) 200-200-20 MG/5ML suspension 30 mL  30 mL Oral Q4H PRN Kristeen Mans, NP      . ARIPiprazole (ABILIFY) tablet 5 mg  5 mg Oral QHS Jomarie Longs, MD   5 mg at 02/07/17 2126  . benzonatate (TESSALON) capsule 100 mg  100 mg Oral TID Jomarie Longs, MD   100 mg at 02/08/17 1318  . DULoxetine (CYMBALTA) DR capsule 60 mg  60 mg Oral Daily Kristeen Mans, NP   60 mg at 02/08/17 1015  . gabapentin (NEURONTIN) capsule 300 mg  300 mg Oral TID Jackelyn Poling, NP   300 mg at 02/08/17 1318  . [START ON 02/09/2017] guaiFENesin (MUCINEX) 12 hr tablet 600 mg  600 mg Oral BID Jomarie Longs, MD       Followed by  . [START ON 02/12/2017] guaiFENesin (MUCINEX) 12 hr tablet 600 mg  600 mg Oral BID PRN Saramma Eappen, MD      . lidocaine (LIDODERM) 5 % 1 patch  1 patch Transdermal Q24H Jomarie Longs, MD   1 patch at 02/08/17 1318  . magnesium hydroxide (MILK OF MAGNESIA) suspension 30 mL  30 mL Oral Daily PRN Kristeen Mans, NP   30 mL at 02/07/17 0759  . metoprolol tartrate (LOPRESSOR) tablet 25 mg  25 mg Oral TID Jackelyn Poling, NP   25 mg  at 02/07/17 1646  . OLANZapine zydis (ZYPREXA) disintegrating tablet 5 mg  5 mg Oral TID PRN Jomarie Longs, MD   5 mg at 02/08/17 0121   Or  . OLANZapine (ZYPREXA) injection 5 mg  5 mg Intramuscular TID PRN Jomarie Longs, MD      . pantoprazole (PROTONIX) EC tablet 40 mg  40 mg Oral Daily Kristeen Mans, NP   40 mg at 02/08/17 1015  . phenytoin (DILANTIN) ER capsule 100 mg  100 mg Oral BID Jackelyn Poling, NP   100 mg at 02/08/17 1015  . polyethylene glycol (MIRALAX / GLYCOLAX) packet 17 g  17 g Oral Daily Saramma Eappen, MD   17 g at 02/07/17 1645  . sodium chloride (OCEAN) 0.65 % nasal spray 1 spray  1 spray Each Nare PRN Jomarie Longs, MD      . traZODone (DESYREL) tablet 100 mg  100 mg Oral QHS Jomarie Longs, MD   100 mg at 02/07/17 2126    Lab Results:  Results for orders placed or performed during the hospital encounter of 02/06/17 (from the past 48 hour(s))  Pregnancy, urine     Status: None   Collection Time: 02/07/17 10:00 PM  Result Value Ref Range   Preg Test, Ur NEGATIVE NEGATIVE    Comment:        THE SENSITIVITY OF THIS METHODOLOGY IS >20 mIU/mL. Performed at [facility], [address] [place], [place] [address]   TSH     Status: None   Collection Time: 02/08/17  6:06 AM  Result Value Ref Range   TSH 0.779 0.350 - 4.500 uIU/mL    Comment: Performed by a 3rd Generation assay with a functional sensitivity of <=0.01 uIU/mL. Performed at [facility], [address], [place], [place] [address]   Lipid panel     Status: Abnormal   Collection Time: 02/08/17  6:06 AM  Result Value Ref Range   Cholesterol 206 (H) 0 - 200 mg/dL   Triglycerides 84 <098 mg/dL   HDL 63 >11 mg/dL   Total CHOL/HDL Ratio 3.3 RATIO   VLDL 17 0 - 40 mg/dL   LDL Cholesterol 914 (H) 0 - 99 mg/dL    Comment:        Total Cholesterol/HDL:CHD Risk Coronary Heart Disease Risk Table                     Men   Women  1/2 Average Risk   3.4   3.3  Average  Risk        5.0   4.4  2 X Average Risk   9.6   7.1  3 X Average Risk  23.4   11.0        Use the calculated Patient Ratio above and the CHD Risk Table to determine the patient's CHD Risk.        ATP III CLASSIFICATION (LDL):  <100     mg/dL   Optimal  [id number]  mg/dL   Near or Above                    Optimal  130-159  mg/dL   Borderline  [id number]  mg/dL   High  >086>190     mg/dL   Very High Performed at [facility], [address] [place], [place] [address]     Blood Alcohol level:  Lab Results  Component Value Date   ETH <5 02/05/2017    Metabolic Disorder Labs: No results found for: HGBA1C, MPG No results found for: PROLACTIN Lab Results  Component Value Date   CHOL 206 (H) 02/08/2017   TRIG 84 02/08/2017   HDL 63 02/08/2017   CHOLHDL 3.3 02/08/2017   VLDL 17 02/08/2017   LDLCALC 126 (H) 02/08/2017    Physical Findings: AIMS: Facial and Oral Movements Muscles of Facial Expression: None, normal Lips and Perioral Area: None, normal Jaw: None, normal Tongue: None, normal,Extremity Movements Upper (arms, wrists, hands, fingers): None, normal Lower (legs, knees, ankles, toes): None, normal, Trunk Movements Neck, shoulders, hips: None, normal, Overall Severity Severity of abnormal movements (highest score from questions above): None, normal Incapacitation due to abnormal movements: None, normal Patient's awareness of abnormal movements (rate only patient's report): No Awareness, Dental Status Current problems with teeth and/or dentures?: No Does patient usually wear dentures?: No  CIWA:    COWS:     Musculoskeletal: Strength & Muscle Tone: within normal limits Gait & Station: normal Patient leans: N/A  Psychiatric Specialty Exam: Physical Exam  Review of Systems  Constitutional: Positive for fever and malaise/fatigue.  HENT: Positive for congestion.   Respiratory: Positive for cough.   Neurological: Positive for weakness.  Psychiatric/Behavioral: Positive  for depression, hallucinations and suicidal ideas. The patient is nervous/anxious.     Blood pressure (!) 91/56, pulse 91, temperature 100.1 F (37.8 C), temperature source Oral, resp. rate 16, height 5' 0.5" (1.537 m), weight 114.8 kg (253 lb).Body mass index is 48.6 kg/m.  General Appearance: Casual  Eye Contact:  Fair  Speech:  Slow  Volume:  Decreased  Mood:  Anxious, Depressed and Dysphoric  Affect:  Constricted and Depressed  Thought Process:  Descriptions of Associations: Circumstantial  Orientation:  Full (Time, Place, and Person)  Thought Content:  Rumination  Suicidal Thoughts:  Yes.  without intent/plan  Homicidal Thoughts:  Yes.  without intent/plan  Memory:  Immediate;   Fair Recent;   Fair Remote;   Fair  Judgement:  Fair  Insight:  Fair  Psychomotor Activity:  Decreased  Concentration:  Concentration: Fair and Attention Span: Fair  Recall:  [employer]  Fund of Knowledge:  Good  Language:  Fair  Akathisia:  No  Handed:  Right  AIMS (if indicated):     Assets:  Communication Skills Desire for Improvement  ADL's:  Intact  Cognition:  WNL  Sleep:  Number of Hours: 4.75     Treatment Plan Summary: patient continued to require inpatient treatment  Continue Abilify 5 mg daily to  help depression and hallucination again Continue and Dilantin 100 mg twice a day and Neurontin 300 mg 3 times a day for mood and seizures.we will get Dilantin level. TSH normal, pregnancy test negative, CBC normalin regard alcohol level less than 5 Labs reviewed.  Total cholesterol 206, LDL 126, Continue trazodone 100 mg at bedtime we will continue to monitor medication side effects and efficacy Patient having cold symptoms we will rule out flu. We will do flu test and placed on droplet precaution and isolation. Increase collateral information.  Discussed medication side effects and benefits. When feeling better encouraged to persevere in group milieu therapy.   Nahdia Doucet T., MD 02/08/2017,  2:53 PM

## 2017-02-08 NOTE — ED Provider Notes (Signed)
[facility]-EMERGENCY DEPT Provider Note   CSN: [id number] Arrival date & time: 02/08/17  1803  By signing my name below, I, Katherine Morse, attest that this documentation has been prepared under the direction and in the presence of Raeford Razor, MD. Electronically Signed: Rosario Morse, ED Scribe. 02/08/17. 6:21 PM.  History   Chief Complaint Chief Complaint  Patient presents with  . Flu-like symptoms   The history is provided by the patient and medical records. No language interpreter was used.    HPI Comments: Katherine Morse is a 46 y.o. female BIB EMS from [facility], who presents to the Emergency Department complaining of persistent, gradually worsening non-productive cough beginning two days ago. She reports associated chest pain secondary to coughing only, generalized myalgias, mild nausea, fever, headache, and loss of appetite. Pt was given Tylenol this morning which gave temporary relief of her fever, but this has since worsened again. Pt was screened this morning at [facility] for Influenza which was positive for strain A.She has had decreased PO intake since the onset of her symptoms d/t her loss of appetite. Pt denies vomiting, sore throat, shortness of breath, urgency, frequency, hematuria, dysuria, difficulty urinating, or any other associated symptoms.   Past Medical History:  Diagnosis Date  . Anxiety   . Bipolar 1 disorder (HCC)   . Depression   . Hypertension   . Schizophrenia (HCC)   . Seizures [facility])    Patient Active Problem List   Diagnosis Date Noted  . Convulsions/seizures (HCC) 02/07/2017  . Essential hypertension 02/07/2017  . Schizoaffective disorder, depressive type (HCC) 02/06/2017   Past Surgical History:  Procedure Laterality Date  . ABLATION    . CESAREAN SECTION     OB History    No data available     Home Medications    Prior to Admission medications   Medication Sig Start Date End Date Taking? Authorizing Provider  amitriptyline (ELAVIL) 50 MG  tablet Take by mouth. 12/04/12  Yes Historical Provider, MD  diclofenac (VOLTAREN) 75 MG EC tablet Take 75 mg by mouth. 09/10/16  Yes Historical Provider, MD  DULoxetine (CYMBALTA) 60 MG capsule Take 60 mg by mouth daily.   Yes Historical Provider, MD  DULoxetine (CYMBALTA) 60 MG capsule TAKE ONE CAPSULE BY MOUTH TWICE DAILY 02/03/13  Yes Historical Provider, MD  famotidine (PEPCID) 20 MG tablet Take 20 mg by mouth. 11/08/16  Yes Historical Provider, MD  gabapentin (NEURONTIN) 100 MG capsule Take 100 mg by mouth. 11/08/16  Yes Historical Provider, MD  gabapentin (NEURONTIN) 300 MG capsule Take 300 mg by mouth 3 (three) times daily.   Yes Historical Provider, MD  ibuprofen (ADVIL,MOTRIN) 800 MG tablet Take 800 mg by mouth. 11/08/16  Yes Historical Provider, MD  linaclotide (LINZESS) 145 MCG CAPS capsule Take 145 mcg by mouth daily before breakfast.   Yes Historical Provider, MD  methocarbamol (ROBAXIN) 500 MG tablet Take 500-1,000 mg by mouth. 09/10/16  Yes Historical Provider, MD  metoprolol tartrate (LOPRESSOR) 25 MG tablet Take 25 mg by mouth 3 (three) times daily.   Yes Historical Provider, MD  metoprolol tartrate (LOPRESSOR) 25 MG tablet Take 25 mg by mouth. 11/08/16  Yes Historical Provider, MD  omeprazole (PRILOSEC) 20 MG capsule Take 20 mg by mouth daily.   Yes Historical Provider, MD  omeprazole (PRILOSEC) 20 MG capsule Take by mouth. 12/04/12  Yes Historical Provider, MD  oxyCODONE-acetaminophen (PERCOCET) 10-325 MG tablet Take by mouth. 12/04/12  Yes Historical Provider, MD  oxyCODONE-acetaminophen (PERCOCET) 10-325  MG tablet Take by mouth. 01/04/13  Yes Historical Provider, MD  phenytoin (DILANTIN) 100 MG ER capsule Take 1 capsule (100 mg total) by mouth 2 (two) times daily. 01/24/17  Yes Garlon Hatchet, PA-C  phenytoin (DILANTIN) 100 MG ER capsule TAKE THREE CAPSULES BY MOUTH EVERY MORNING THEN TAKE FOUR CAPSULES BY MOUTH EVERY EVENING 01/07/13  Yes Historical Provider, MD  QUEtiapine (SEROQUEL)  100 MG tablet Take 100 mg by mouth. 11/08/16  Yes Historical Provider, MD  QUEtiapine (SEROQUEL) 300 MG tablet TAKE ONE TABLET BY MOUTH EVERY NIGHT AT BEDTIME 02/03/13  Yes Historical Provider, MD   Family History Family History  Problem Relation Age of Onset  . Hypertension Mother   . Mental illness Son    Social History Social History  Substance Use Topics  . Smoking status: Former Games developer  . Smokeless tobacco: Never Used  . Alcohol use No   Allergies   Morphine; Other; Compazine [prochlorperazine edisylate]; Morphine and related; Penicillins; and Vistaril [hydroxyzine hcl]   Review of Systems Review of Systems  Constitutional: Positive for appetite change and fever.  HENT: Negative for sore throat.   Respiratory: Positive for cough. Negative for shortness of breath.   Cardiovascular: Positive for chest pain (2/2 cough).  Gastrointestinal: Positive for nausea. Negative for vomiting.  Genitourinary: Negative for difficulty urinating, dysuria, frequency, hematuria and urgency.  Musculoskeletal: Positive for myalgias (generalized).  Neurological: Positive for headaches.  All other systems reviewed and are negative.  Physical Exam Updated Vital Signs BP (!) 89/56 (BP Location: Left Arm)   Pulse (!) 128   Temp 99.5 F (37.5 C)   Resp (!) 22   Ht 5' 0.5" (1.537 m)   Wt 253 lb (114.8 kg)   SpO2 95%   BMI 48.60 kg/m   Physical Exam  Constitutional: She appears well-developed and well-nourished.  HENT:  Head: Normocephalic.  Right Ear: External ear normal.  Left Ear: External ear normal.  Nose: Nose normal.  Mouth/Throat: Oropharynx is clear and moist.  Eyes: Conjunctivae are normal. Right eye exhibits no discharge. Left eye exhibits no discharge.  Neck: Normal range of motion.  Cardiovascular: Regular rhythm and normal heart sounds.  Tachycardia present.   No murmur heard. Pulmonary/Chest: Effort normal and breath sounds normal. Tachypnea noted. No respiratory distress.  She has no wheezes. She has no rales.  Abdominal: Soft. She exhibits no distension. There is no tenderness. There is no rebound and no guarding.  Musculoskeletal: Normal range of motion. She exhibits no edema or tenderness.  Neurological: She is alert. No cranial nerve deficit. Coordination normal.  Skin: Skin is warm and dry. No rash noted. No erythema. No pallor.  Psychiatric: She has a normal mood and affect. Her behavior is normal.  Nursing note and vitals reviewed.  ED Treatments / Results  DIAGNOSTIC STUDIES: Oxygen Saturation is 95% on RA, adequate by my interpretation.   COORDINATION OF CARE: 6:18 PM-Discussed next steps with pt. Pt verbalized understanding and is agreeable with the plan.   Labs (all labs ordered are listed, but only abnormal results are displayed) Labs Reviewed  LIPID PANEL - Abnormal; Notable for the following:       Result Value   Cholesterol 206 (*)    LDL Cholesterol 126 (*)    All other components within normal limits  INFLUENZA PANEL BY PCR (TYPE A & B) - Abnormal; Notable for the following:    Influenza A By PCR POSITIVE (*)    All other components within normal  limits  PREGNANCY, URINE  TSH  HEMOGLOBIN A1C  PROLACTIN  PHENYTOIN LEVEL, FREE AND TOTAL   EKG  EKG Interpretation None      Radiology No results found.  Procedures Procedures   Medications Ordered in ED Medications  acetaminophen (TYLENOL) tablet 650 mg (650 mg Oral Given 02/07/17 2127)  alum & mag hydroxide-simeth (MAALOX/MYLANTA) 200-200-20 MG/5ML suspension 30 mL (not administered)  magnesium hydroxide (MILK OF MAGNESIA) suspension 30 mL (30 mLs Oral Given 02/07/17 0759)  DULoxetine (CYMBALTA) DR capsule 60 mg (60 mg Oral Given 02/08/17 1015)  pantoprazole (PROTONIX) EC tablet 40 mg (40 mg Oral Given 02/08/17 1015)  gabapentin (NEURONTIN) capsule 300 mg (300 mg Oral Given 02/08/17 1700)  phenytoin (DILANTIN) ER capsule 100 mg (100 mg Oral Given 02/08/17 1700)  metoprolol  tartrate (LOPRESSOR) tablet 25 mg (25 mg Oral Not Given 02/08/17 1700)  lidocaine (LIDODERM) 5 % 1 patch (1 patch Transdermal Patch Applied 02/08/17 1318)  ARIPiprazole (ABILIFY) tablet 5 mg (5 mg Oral Given 02/07/17 2126)  traZODone (DESYREL) tablet 100 mg (100 mg Oral Given 02/07/17 2126)  OLANZapine zydis (ZYPREXA) disintegrating tablet 5 mg (5 mg Oral Given 02/08/17 0121)    Or  OLANZapine (ZYPREXA) injection 5 mg ( Intramuscular See Alternative 02/08/17 0121)  polyethylene glycol (MIRALAX / GLYCOLAX) packet 17 g (17 g Oral Not Given 02/08/17 1016)  benzonatate (TESSALON) capsule 100 mg (100 mg Oral Given 02/08/17 1700)  sodium chloride (OCEAN) 0.65 % nasal spray 1 spray (not administered)  guaiFENesin (MUCINEX) 12 hr tablet 600 mg (600 mg Oral Given 02/08/17 1319)    Followed by  guaiFENesin (MUCINEX) 12 hr tablet 600 mg (not administered)    Followed by  guaiFENesin (MUCINEX) 12 hr tablet 600 mg (not administered)  pseudoephedrine (SUDAFED) tablet 60 mg (60 mg Oral Given 02/08/17 1318)   Initial Impression / Assessment and Plan / ED Course  I have reviewed the triage vital signs and the nursing notes.  Pertinent labs & imaging results that were available during my care of the patient were reviewed by me and considered in my medical decision making (see chart for details).     45 rolled female with recently diagnosed flu. She is hypotensive, but nontoxic. Fluid resuscitate. Admission for ongoing treatment.  Final Clinical Impressions(s) / ED Diagnoses   Final diagnoses:  Fever  Influenza  New Prescriptions New Prescriptions   No medications on file   I personally preformed the services scribed in my presence. The recorded information has been reviewed is accurate. Raeford RazorStephen Deya Bigos, MD.     Raeford RazorStephen Simmone Cape, MD 02/19/17 [phone]

## 2017-02-08 NOTE — H&P (Signed)
Katherine Morse ZOX:[id number] DOB: 09-22-1971 DOA: 02/06/2017     PCP: No PCP Per Patient   Outpatient Specialists: none Patient coming from:      home Lives with roomate    Chief Complaint: Depression  HPI: Katherine Morse is a 46 y.o. female with medical history significant of Bipolar, h/o depression, anxiety, schizophrenia    Presented with 2 weeks of depression with associated suicidal ideation without noted plan she was brought in by [facility]. Patient initially was admitted to behavioral health. While they are patient became febrile and had myalgias She endorses mild coughing was found to have Morse-grade fever nausea and fatigueshe was noted to be hypotensive and sent to emergency department. Reports not feeling well for the past 3 days with productive cough. Denies sick contacts In emergency department her initial blood pressure was down to systolic and 80s. She was diagnosed with influenza given the patient is meeting sepsis criteria she was initially started on antibiotics  She and still have had some hearing of voices and poor sleep Katherine Morse is to have some suicidal thoughts but denies any plan he also have had some homicidal thoughts towards her rheumatoid but also had no plan Regarding pertinent Chronic problems: She has history of noncompliance and her home medications He has known history of hypertension and seizure disorder last seizure was 1 week ago.  was chronic back pain complaints suspicious for malingering.   IN ER:  Temp (24hrs), Avg:99.8 F (37.7 C), Min:98.5 F (36.9 C), Max:100.5 F (38.1 C)     RR 18 94%Hr17 Bp100/53 Lactic acid 0.92  WBC 8.2 hemoglobin 12.5 Sodium 134 potassium 3.6 cr  0.68 Positive for influenza A  CXR non acute Following Medications were ordered in ER: Medications  acetaminophen (TYLENOL) tablet 650 mg (650 mg Oral Given 02/07/17 2127)  alum & mag hydroxide-simeth (MAALOX/MYLANTA) 200-200-20 MG/5ML suspension 30 mL (not administered)  magnesium  hydroxide (MILK OF MAGNESIA) suspension 30 mL (30 mLs Oral Given 02/07/17 0759)  DULoxetine (CYMBALTA) DR capsule 60 mg (60 mg Oral Given 02/08/17 1015)  pantoprazole (PROTONIX) EC tablet 40 mg (40 mg Oral Given 02/08/17 1015)  gabapentin (NEURONTIN) capsule 300 mg (300 mg Oral Given 02/08/17 1700)  phenytoin (DILANTIN) ER capsule 100 mg (100 mg Oral Given 02/08/17 1700)  metoprolol tartrate (LOPRESSOR) tablet 25 mg (25 mg Oral Not Given 02/08/17 1700)  lidocaine (LIDODERM) 5 % 1 patch (1 patch Transdermal Patch Applied 02/08/17 1318)  ARIPiprazole (ABILIFY) tablet 5 mg (5 mg Oral Given 02/07/17 2126)  traZODone (DESYREL) tablet 100 mg (100 mg Oral Given 02/07/17 2126)  OLANZapine zydis (ZYPREXA) disintegrating tablet 5 mg (5 mg Oral Given 02/08/17 0121)    Or  OLANZapine (ZYPREXA) injection 5 mg ( Intramuscular See Alternative 02/08/17 0121)  polyethylene glycol (MIRALAX / GLYCOLAX) packet 17 g (17 g Oral Not Given 02/08/17 1016)  benzonatate (TESSALON) capsule 100 mg (100 mg Oral Given 02/08/17 1700)  sodium chloride (OCEAN) 0.65 % nasal spray 1 spray (not administered)  guaiFENesin (MUCINEX) 12 hr tablet 600 mg (600 mg Oral Given 02/08/17 1319)    Followed by  guaiFENesin (MUCINEX) 12 hr tablet 600 mg (not administered)    Followed by  guaiFENesin (MUCINEX) 12 hr tablet 600 mg (not administered)  oseltamivir (TAMIFLU) capsule 75 mg (not administered)  vancomycin (VANCOCIN) IVPB 1000 mg/200 mL premix (not administered)  sodium chloride 0.9 % bolus 1,000 mL (not administered)  pseudoephedrine (SUDAFED) tablet 60 mg (60 mg Oral Given 02/08/17 1318)  sodium  chloride 0.9 % bolus 2,000 mL (0 mLs Intravenous Stopped 02/08/17 2214)  acetaminophen (TYLENOL) tablet 650 mg (650 mg Oral Given 02/08/17 1924)  piperacillin-tazobactam (ZOSYN) IVPB 3.375 g (0 g Intravenous Stopped 02/08/17 2118)      Hospitalist was called for admission for Sepsis secondary to influenza A  Review of Systems:    Pertinent positives  include:  Fevers, chills, fatigue, body aches productive cough  Constitutional:  No weight loss, night sweats, weight loss  HEENT:  No headaches, Difficulty swallowing,Tooth/dental problems,Sore throat,  No sneezing, itching, ear ache, nasal congestion, post nasal drip,  Cardio-vascular:  No chest pain, Orthopnea, PND, anasarca, dizziness, palpitations.no Bilateral lower extremity swelling  GI:  No heartburn, indigestion, abdominal pain, nausea, vomiting, diarrhea, change in bowel habits, loss of appetite, melena, blood in stool, hematemesis Resp:  no shortness of breath at rest. No dyspnea on exertion, No excess mucus, no, No non-productive cough, No coughing up of blood.No change in color of mucus.No wheezing. Skin:  no rash or lesions. No jaundice GU:  no dysuria, change in color of urine, no urgency or frequency. No straining to urinate.  No flank pain.  Musculoskeletal:  No joint pain or no joint swelling. No decreased range of motion. No back pain.  Psych:  No change in mood or affect. No depression or anxiety. No memory loss.  Neuro: no localizing neurological complaints, no tingling, no weakness, no double vision, no gait abnormality, no slurred speech, no confusion  As per HPI otherwise 10 point review of systems negative.   Past Medical History: Past Medical History:  Diagnosis Date  . Anxiety   . Bipolar 1 disorder (HCC)   . Depression   . Hypertension   . Schizophrenia (HCC)   . Seizures (HCC)    Past Surgical History:  Procedure Laterality Date  . ABLATION    . CESAREAN SECTION       Social History:  Ambulatory   independently      reports that she has quit smoking. She has never used smokeless tobacco. She reports that she does not drink alcohol or use drugs.  Allergies:   Allergies  Allergen Reactions  . Morphine   . Other   . Compazine [Prochlorperazine Edisylate] Anxiety  . Morphine And Related Anxiety  . Penicillins Anxiety and Other (See  Comments)    Has patient had a PCN reaction causing immediate rash, facial/tongue/throat swelling, SOB or lightheadedness with hypotension: No Has patient had a PCN reaction causing severe rash involving mucus membranes or skin necrosis: No  Has patient had a PCN reaction that required hospitalization: No  Has patient had a PCN reaction occurring within the last 10 years: No  If all of the above answers are "NO", then may proceed with Cephalosporin use.   Dennie Maizes [Hydroxyzine Hcl] Anxiety       Family History:   Family History  Problem Relation Age of Onset  . Hypertension Mother   . Mental illness Son     Medications: Prior to Admission medications   Medication Sig Start Date End Date Taking? Authorizing Provider  amitriptyline (ELAVIL) 50 MG tablet Take by mouth. 12/04/12  Yes Historical Provider, MD  diclofenac (VOLTAREN) 75 MG EC tablet Take 75 mg by mouth. 09/10/16  Yes Historical Provider, MD  DULoxetine (CYMBALTA) 60 MG capsule Take 60 mg by mouth daily.   Yes Historical Provider, MD  DULoxetine (CYMBALTA) 60 MG capsule TAKE ONE CAPSULE BY MOUTH TWICE DAILY 02/03/13  Yes Historical Provider,  MD  famotidine (PEPCID) 20 MG tablet Take 20 mg by mouth. 11/08/16  Yes Historical Provider, MD  gabapentin (NEURONTIN) 100 MG capsule Take 100 mg by mouth. 11/08/16  Yes Historical Provider, MD  gabapentin (NEURONTIN) 300 MG capsule Take 300 mg by mouth 3 (three) times daily.   Yes Historical Provider, MD  ibuprofen (ADVIL,MOTRIN) 800 MG tablet Take 800 mg by mouth. 11/08/16  Yes Historical Provider, MD  linaclotide (LINZESS) 145 MCG CAPS capsule Take 145 mcg by mouth daily before breakfast.   Yes Historical Provider, MD  methocarbamol (ROBAXIN) 500 MG tablet Take 500-1,000 mg by mouth. 09/10/16  Yes Historical Provider, MD  metoprolol tartrate (LOPRESSOR) 25 MG tablet Take 25 mg by mouth 3 (three) times daily.   Yes Historical Provider, MD  metoprolol tartrate (LOPRESSOR) 25 MG tablet  Take 25 mg by mouth. 11/08/16  Yes Historical Provider, MD  omeprazole (PRILOSEC) 20 MG capsule Take 20 mg by mouth daily.   Yes Historical Provider, MD  omeprazole (PRILOSEC) 20 MG capsule Take by mouth. 12/04/12  Yes Historical Provider, MD  oxyCODONE-acetaminophen (PERCOCET) 10-325 MG tablet Take by mouth. 12/04/12  Yes Historical Provider, MD  oxyCODONE-acetaminophen (PERCOCET) 10-325 MG tablet Take by mouth. 01/04/13  Yes Historical Provider, MD  phenytoin (DILANTIN) 100 MG ER capsule Take 1 capsule (100 mg total) by mouth 2 (two) times daily. 01/24/17  Yes Garlon Hatchet, PA-C  phenytoin (DILANTIN) 100 MG ER capsule TAKE THREE CAPSULES BY MOUTH EVERY MORNING THEN TAKE FOUR CAPSULES BY MOUTH EVERY EVENING 01/07/13  Yes Historical Provider, MD  QUEtiapine (SEROQUEL) 100 MG tablet Take 100 mg by mouth. 11/08/16  Yes Historical Provider, MD  QUEtiapine (SEROQUEL) 300 MG tablet TAKE ONE TABLET BY MOUTH EVERY NIGHT AT BEDTIME 02/03/13  Yes Historical Provider, MD    Physical Exam: Patient Vitals for the past 24 hrs:  BP Temp Temp src Pulse Resp SpO2 Height Weight  02/08/17 2027 (!) 100/53 100 F (37.8 C) Oral 117 18 94 % - -  02/08/17 1856 - 100.5 F (38.1 C) Rectal - - - - -  02/08/17 1814 - - - - - - 5\' 2"  (1.575 m) 114.8 kg (253 lb)  02/08/17 1810 - - - - - 95 % - -  02/08/17 1743 (!) 89/56 - - (!) 128 - - - -  02/08/17 1742 (!) 89/56 - - (!) 128 (!) 22 - - -  02/08/17 1700 (!) 80/36 99.5 F (37.5 C) - (!) 108 20 - - -  02/08/17 1500 (!) 80/37 100.2 F (37.9 C) Oral (!) 105 - - - -  02/08/17 1108 (!) 91/56 100.1 F (37.8 C) Oral 91 16 - - -  02/08/17 0701 (!) 80/41 - - 83 - - - -  02/08/17 0700 (!) 93/57 98.5 F (36.9 C) - 82 18 - - -    1. General:  in No Acute distress 2. Psychological: Alert and   Oriented 3. Head/ENT:    Dry Mucous Membranes                          Head Non traumatic, neck supple                            Poor Dentition 4. SKIN:   decreased Skin turgor,  Skin  clean Dry and intact no rash 5. Heart: Regular rate and rhythm no  Murmur, Rub or  gallop 6. Lungs Clear to auscultation bilaterally, no wheezes or crackles   7. Abdomen: Soft,    non-tender, Non distended 8. Lower extremities: no clubbing, cyanosis, or edema 9. Neurologically Grossly intact, moving all 4 extremities equally  10. MSK: Normal range of motion   body mass index is 46.27 kg/m.  Labs on Admission:   Labs on Admission: I have personally reviewed following labs and imaging studies  CBC:  Recent Labs Lab 02/05/17 2305 02/08/17 1912  WBC 9.3 8.2  NEUTROABS  --  7.4  HGB 12.1 12.5  HCT 37.4 37.8  MCV 88.0 85.5  PLT 269 221   Basic Metabolic Panel:  Recent Labs Lab 02/05/17 2305 02/08/17 1912  NA 141 134*  K 3.7 3.6  CL 110 103  CO2 24 23  GLUCOSE 98 91  BUN 17 18  CREATININE 0.70 0.68  CALCIUM 9.3 9.2   GFR: Estimated Creatinine Clearance: 105.4 mL/min (by C-G formula based on SCr of 0.68 mg/dL). Liver Function Tests:  Recent Labs Lab 02/05/17 2305 02/08/17 1912  AST 22 20  ALT 15 15  ALKPHOS 86 84  BILITOT 0.3 0.7  PROT 8.3* 8.2*  ALBUMIN 4.4 4.2   No results for input(s): LIPASE, AMYLASE in the last 168 hours. No results for input(s): AMMONIA in the last 168 hours. Coagulation Profile: No results for input(s): INR, PROTIME in the last 168 hours. Cardiac Enzymes: No results for input(s): CKTOTAL, CKMB, CKMBINDEX, TROPONINI in the last 168 hours. BNP (last 3 results) No results for input(s): PROBNP in the last 8760 hours. HbA1C: No results for input(s): HGBA1C in the last 72 hours. CBG: No results for input(s): GLUCAP in the last 168 hours. Lipid Profile:  Recent Labs  02/08/17 0606  CHOL 206*  HDL 63  LDLCALC 126*  TRIG 84  CHOLHDL 3.3   Thyroid Function Tests:  Recent Labs  02/08/17 0606  TSH 0.779   Anemia Panel: No results for input(s): VITAMINB12, FOLATE, FERRITIN, TIBC, IRON, RETICCTPCT in the last 72 hours. Urine  analysis: No results found for: COLORURINE, APPEARANCEUR, LABSPEC, PHURINE, GLUCOSEU, HGBUR, BILIRUBINUR, KETONESUR, PROTEINUR, UROBILINOGEN, NITRITE, LEUKOCYTESUR Sepsis Labs: @LABRCNTIP (procalcitonin:4,lacticidven:4) )No results found for this or any previous visit (from the past 240 hour(s)).     UA  ordered  No results found for: HGBA1C  Estimated Creatinine Clearance: 105.4 mL/min (by C-G formula based on SCr of 0.68 mg/dL).  BNP (last 3 results) No results for input(s): PROBNP in the last 8760 hours.   ECG REPORT  not obtained  Filed Weights   02/06/17 1738 02/08/17 1814  Weight: 114.8 kg (253 lb) 114.8 kg (253 lb)     Cultures: No results found for: SDES, SPECREQUEST, CULT, REPTSTATUS   Radiological Exams on Admission: Dg Chest 2 View  Result Date: 02/08/2017 CLINICAL DATA:  Acute onset of hypotension. Positive for influenza. Initial encounter. EXAM: CHEST  2 VIEW COMPARISON:  Chest radiograph performed 09/01/2016 FINDINGS: The lungs are well-aerated. Pulmonary vascularity is at the upper limits of normal. There is no evidence of focal opacification, pleural effusion or pneumothorax. The heart is borderline normal in size. There is apparent tortuosity of the thoracic aorta. No acute osseous abnormalities are seen. Mild chronic compression deformities are again noted along the lower thoracic spine. IMPRESSION: No acute cardiopulmonary process seen. Electronically Signed   By: Roanna Raider M.D.   On: 02/08/2017 18:59    Chart has been reviewed    Assessment/Plan  47 y.o. female with medical history significant  of Bipolar, h/o depression, anxiety, schizophrenia admitted for sepsis secondary to influenza A  Present on Admission: . Schizoaffective disorder, depressive type (HCC)Continue home medications discussed the behavioral health will see patient in consult . Essential hypertension - Hold home medications . Sepsis (HCC) Is likely secondary to influenza but given  hypotension will obtain blood cultures and continue antibiotics for now currently hypotension resolved lactic acid within normal limits. Continue to monitor overnight . Influenza A treat with Tamiflu   Other plan as per orders.  DVT prophylaxis:    Lovenox     Code Status:  FULL CODE   Family Communication:   Family not  at  Bedside     Disposition Plan:                              Back to current facility when stable                                                          Consults called: Behavioral Health consulted  Admission status:     obs   Level of care       meds      I have spent a total of 56 min on this admission   Celene Pippins 02/08/2017, 11:22 PM    [facility]  Pager [phone]   after 2 AM please page floor coverage PA If 7AM-7PM, please contact the day team taking care of the patient  [email or web address]  Password [email or web address]

## 2017-02-08 NOTE — [facility] Group Notes (Signed)
[facility] Group Notes: (Clinical Social Work)   02/08/2017      Type of Therapy:  Group Therapy   Participation Level:  Did Not Attend despite MHT prompting   Akansha Wyche Grossman-Orr, LCSW 02/08/2017, 12:19 PM     

## 2017-02-08 NOTE — ED Notes (Signed)
Bed: WA21 Expected date:  Expected time:  Means of arrival:  [facility]

## 2017-02-08 NOTE — Progress Notes (Signed)
Patient ID: Katherine Morse, female   DOB: [birth year], 46 y.o.   MRN: [id number]    D: Pt was in the bed most of the morning, she reported that she did not feel well. Pt was seen by the doctor he had some concerns regarding the patients cough, he wanted a flu test. This Clinical research associatewriter consulted with Gearldine Bienenstockankia NP regarding patients condition to include low BP, low grade fever, Nausea, and fatigue. Orders noted to put patient on droplet precautions, get a flu, and place in isolation.  Pt reported being negative SI/HI, no AH/VH noted. A: 15 min checks continued for patient safety. R: Pt safety maintained.

## 2017-02-08 NOTE — ED Notes (Signed)
Ordering new safety sitter for inpatient treatment since current sitter will leave when pt is admitted and she cannot be taken upstairs without a sitter for SI.  Debera Latold Erin RN receiving pt upstairs about the delay.

## 2017-02-09 ENCOUNTER — Encounter (HOSPITAL_COMMUNITY): Payer: Self-pay | Admitting: Internal Medicine

## 2017-02-09 ENCOUNTER — Inpatient Hospital Stay (HOSPITAL_COMMUNITY)
Admission: AD | Admit: 2017-02-09 | Discharge: 2017-02-12 | DRG: 872 | Disposition: A | Discharge status: Other Acute Inpatient Hospital | Payer: Medicaid Other | Source: Ambulatory Visit | Attending: Internal Medicine | Admitting: Internal Medicine

## 2017-02-09 DIAGNOSIS — Z79891 Long term (current) use of opiate analgesic: Secondary | ICD-10-CM

## 2017-02-09 DIAGNOSIS — Z888 Allergy status to other drugs, medicaments and biological substances status: Secondary | ICD-10-CM

## 2017-02-09 DIAGNOSIS — R569 Unspecified convulsions: Secondary | ICD-10-CM

## 2017-02-09 DIAGNOSIS — A419 Sepsis, unspecified organism: Principal | ICD-10-CM | POA: Diagnosis present

## 2017-02-09 DIAGNOSIS — Z87891 Personal history of nicotine dependence: Secondary | ICD-10-CM

## 2017-02-09 DIAGNOSIS — J101 Influenza due to other identified influenza virus with other respiratory manifestations: Secondary | ICD-10-CM | POA: Diagnosis present

## 2017-02-09 DIAGNOSIS — F251 Schizoaffective disorder, depressive type: Secondary | ICD-10-CM | POA: Diagnosis present

## 2017-02-09 DIAGNOSIS — R45851 Suicidal ideations: Secondary | ICD-10-CM | POA: Diagnosis present

## 2017-02-09 DIAGNOSIS — Z818 Family history of other mental and behavioral disorders: Secondary | ICD-10-CM

## 2017-02-09 DIAGNOSIS — E0781 Sick-euthyroid syndrome: Secondary | ICD-10-CM | POA: Diagnosis present

## 2017-02-09 DIAGNOSIS — Z8249 Family history of ischemic heart disease and other diseases of the circulatory system: Secondary | ICD-10-CM

## 2017-02-09 DIAGNOSIS — D649 Anemia, unspecified: Secondary | ICD-10-CM | POA: Diagnosis present

## 2017-02-09 DIAGNOSIS — I10 Essential (primary) hypertension: Secondary | ICD-10-CM | POA: Diagnosis present

## 2017-02-09 DIAGNOSIS — I959 Hypotension, unspecified: Secondary | ICD-10-CM | POA: Diagnosis present

## 2017-02-09 DIAGNOSIS — G40909 Epilepsy, unspecified, not intractable, without status epilepticus: Secondary | ICD-10-CM | POA: Diagnosis present

## 2017-02-09 DIAGNOSIS — Z880 Allergy status to penicillin: Secondary | ICD-10-CM

## 2017-02-09 DIAGNOSIS — Z79899 Other long term (current) drug therapy: Secondary | ICD-10-CM

## 2017-02-09 DIAGNOSIS — F419 Anxiety disorder, unspecified: Secondary | ICD-10-CM | POA: Diagnosis present

## 2017-02-09 LAB — URINALYSIS, ROUTINE W REFLEX MICROSCOPIC
Bilirubin Urine: NEGATIVE
Glucose, UA: NEGATIVE mg/dL
Hgb urine dipstick: NEGATIVE
KETONES UR: 20 mg/dL — AB
LEUKOCYTES UA: NEGATIVE
Nitrite: NEGATIVE
PROTEIN: NEGATIVE mg/dL
Specific Gravity, Urine: 1.019 (ref 1.005–1.030)
pH: 5 (ref 5.0–8.0)

## 2017-02-09 LAB — COMPREHENSIVE METABOLIC PANEL
ALK PHOS: 67 U/L (ref 38–126)
ALT: 14 U/L (ref 14–54)
ANION GAP: 7 (ref 5–15)
AST: 20 U/L (ref 15–41)
Albumin: 3.4 g/dL — ABNORMAL LOW (ref 3.5–5.0)
BILIRUBIN TOTAL: 0.4 mg/dL (ref 0.3–1.2)
BUN: 11 mg/dL (ref 6–20)
CALCIUM: 8.3 mg/dL — AB (ref 8.9–10.3)
CO2: 22 mmol/L (ref 22–32)
CREATININE: 0.64 mg/dL (ref 0.44–1.00)
Chloride: 106 mmol/L (ref 101–111)
GFR calc non Af Amer: >60 mL/min (ref >60)
GLUCOSE: 89 mg/dL (ref 65–99)
Potassium: 3.6 mmol/L (ref 3.5–5.1)
Sodium: 135 mmol/L (ref 135–145)
TOTAL PROTEIN: 6.7 g/dL (ref 6.5–8.1)

## 2017-02-09 LAB — T4, FREE: FREE T4: 0.92 ng/dL (ref 0.61–1.12)

## 2017-02-09 LAB — PHOSPHORUS: Phosphorus: 3.3 mg/dL (ref 2.5–4.6)

## 2017-02-09 LAB — LACTIC ACID, PLASMA
LACTIC ACID, VENOUS: 0.5 mmol/L (ref 0.5–1.9)
LACTIC ACID, VENOUS: 0.7 mmol/L (ref 0.5–1.9)

## 2017-02-09 LAB — CBC
HCT: 31.9 % — ABNORMAL LOW (ref 36.0–46.0)
HEMOGLOBIN: 10.3 g/dL — AB (ref 12.0–15.0)
MCH: 28.1 pg (ref 26.0–34.0)
MCHC: 32.3 g/dL (ref 30.0–36.0)
MCV: 87.2 fL (ref 78.0–100.0)
PLATELETS: 192 10*3/uL (ref 150–400)
RBC: 3.66 MIL/uL — ABNORMAL LOW (ref 3.87–5.11)
RDW: 14.4 % (ref 11.5–15.5)
WBC: 5.1 10*3/uL (ref 4.0–10.5)

## 2017-02-09 LAB — TSH: TSH: 0.223 u[IU]/mL — ABNORMAL LOW (ref 0.350–4.500)

## 2017-02-09 LAB — HEMOGLOBIN A1C
Hgb A1c MFr Bld: 4.4 % — ABNORMAL LOW (ref 4.8–5.6)
Mean Plasma Glucose: 80 mg/dL

## 2017-02-09 LAB — PROCALCITONIN

## 2017-02-09 LAB — PROLACTIN: PROLACTIN: 23.6 ng/mL — AB (ref 4.8–23.3)

## 2017-02-09 LAB — MAGNESIUM: MAGNESIUM: 1.8 mg/dL (ref 1.7–2.4)

## 2017-02-09 MED ORDER — ENOXAPARIN SODIUM 40 MG/0.4ML ~~LOC~~ SOLN: 40.0000 mg | SUBCUTANEOUS | Status: DC

## 2017-02-09 MED ORDER — DULOXETINE HCL 20 MG PO CPEP
40.0000 mg | ORAL_CAPSULE | Freq: Every day | ORAL | Status: DC
  Administered 2017-02-10 – 2017-02-12 (×3): 40 mg via ORAL
  Filled 2017-02-09 (×3): qty 2

## 2017-02-09 MED ORDER — HYDROCODONE-ACETAMINOPHEN 5-325 MG PO TABS
1.0000 | ORAL_TABLET | ORAL | Status: DC | PRN
  Administered 2017-02-09: 2 via ORAL
  Administered 2017-02-09: 1 via ORAL
  Filled 2017-02-09 (×2): qty 2

## 2017-02-09 MED ORDER — QUETIAPINE FUMARATE 100 MG PO TABS: 300.0000 mg | ORAL_TABLET | Freq: Every day | ORAL | Status: DC

## 2017-02-09 MED ORDER — ONDANSETRON HCL 4 MG/2ML IJ SOLN: 4.0000 mg | Freq: Four times a day (QID) | INTRAMUSCULAR | Status: DC | PRN

## 2017-02-09 MED ORDER — ALBUTEROL SULFATE (2.5 MG/3ML) 0.083% IN NEBU
2.5000 mg | INHALATION_SOLUTION | RESPIRATORY_TRACT | Status: DC | PRN
  Administered 2017-02-09 – 2017-02-11 (×2): 2.5 mg via RESPIRATORY_TRACT
  Filled 2017-02-09 (×4): qty 3

## 2017-02-09 MED ORDER — ACETAMINOPHEN 650 MG RE SUPP: 650.0000 mg | Freq: Four times a day (QID) | RECTAL | Status: DC | PRN

## 2017-02-09 MED ORDER — PHENYTOIN SODIUM EXTENDED 100 MG PO CAPS
100.0000 mg | ORAL_CAPSULE | Freq: Two times a day (BID) | ORAL | Status: DC
  Administered 2017-02-09 – 2017-02-11 (×5): 100 mg via ORAL
  Filled 2017-02-09 (×5): qty 1

## 2017-02-09 MED ORDER — GUAIFENESIN-DM 100-10 MG/5ML PO SYRP
5.0000 mL | ORAL_SOLUTION | ORAL | Status: DC | PRN
  Administered 2017-02-09 – 2017-02-11 (×7): 5 mL via ORAL
  Filled 2017-02-09 (×9): qty 10

## 2017-02-09 MED ORDER — GABAPENTIN 300 MG PO CAPS
300.0000 mg | ORAL_CAPSULE | Freq: Three times a day (TID) | ORAL | Status: DC
  Administered 2017-02-09 (×2): 300 mg via ORAL
  Filled 2017-02-09 (×2): qty 1

## 2017-02-09 MED ORDER — ONDANSETRON HCL 4 MG PO TABS: 4.0000 mg | ORAL_TABLET | Freq: Four times a day (QID) | ORAL | Status: DC | PRN

## 2017-02-09 MED ORDER — DEXTROSE 5 % IV SOLN
1.0000 g | INTRAVENOUS | Status: DC
  Administered 2017-02-09: 1 g via INTRAVENOUS
  Filled 2017-02-09: qty 10

## 2017-02-09 MED ORDER — AMITRIPTYLINE HCL 25 MG PO TABS: 50.0000 mg | ORAL_TABLET | Freq: Every day | ORAL | Status: DC

## 2017-02-09 MED ORDER — QUETIAPINE FUMARATE 100 MG PO TABS
100.0000 mg | ORAL_TABLET | Freq: Every day | ORAL | Status: DC
  Administered 2017-02-09 – 2017-02-11 (×3): 100 mg via ORAL
  Filled 2017-02-09 (×3): qty 1

## 2017-02-09 MED ORDER — ACETAMINOPHEN 325 MG PO TABS
650.0000 mg | ORAL_TABLET | Freq: Four times a day (QID) | ORAL | Status: DC | PRN
  Administered 2017-02-09 – 2017-02-12 (×9): 650 mg via ORAL
  Filled 2017-02-09 (×11): qty 2

## 2017-02-09 MED ORDER — GABAPENTIN 100 MG PO CAPS
100.0000 mg | ORAL_CAPSULE | Freq: Three times a day (TID) | ORAL | Status: DC
  Administered 2017-02-09 – 2017-02-12 (×8): 100 mg via ORAL
  Filled 2017-02-09 (×9): qty 1

## 2017-02-09 MED ORDER — GUAIFENESIN ER 600 MG PO TB12
600.0000 mg | ORAL_TABLET | Freq: Two times a day (BID) | ORAL | Status: DC
  Administered 2017-02-09 – 2017-02-12 (×7): 600 mg via ORAL
  Filled 2017-02-09 (×7): qty 1

## 2017-02-09 MED ORDER — DULOXETINE HCL 30 MG PO CPEP
60.0000 mg | ORAL_CAPSULE | Freq: Two times a day (BID) | ORAL | Status: DC
  Administered 2017-02-09: 60 mg via ORAL
  Filled 2017-02-09: qty 2

## 2017-02-09 MED ORDER — DEXTROSE 5 % IV SOLN
500.0000 mg | INTRAVENOUS | Status: DC
  Administered 2017-02-09: 500 mg via INTRAVENOUS
  Filled 2017-02-09: qty 500

## 2017-02-09 MED ORDER — SODIUM CHLORIDE 0.9 % IV SOLN
INTRAVENOUS | Status: AC
  Administered 2017-02-09: 04:00:00 via INTRAVENOUS

## 2017-02-09 MED ORDER — ENOXAPARIN SODIUM 60 MG/0.6ML ~~LOC~~ SOLN
60.0000 mg | Freq: Every day | SUBCUTANEOUS | Status: DC
  Administered 2017-02-09 – 2017-02-12 (×4): 60 mg via SUBCUTANEOUS
  Filled 2017-02-09 (×4): qty 0.6

## 2017-02-09 MED ORDER — OSELTAMIVIR PHOSPHATE 75 MG PO CAPS
75.0000 mg | ORAL_CAPSULE | Freq: Two times a day (BID) | ORAL | Status: DC
  Administered 2017-02-09 – 2017-02-12 (×7): 75 mg via ORAL
  Filled 2017-02-09 (×7): qty 1

## 2017-02-09 NOTE — ED Notes (Signed)
Received bedside report from Aylissa RN 
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
## 2017-02-09 NOTE — Progress Notes (Signed)
Several phone calls back and forth with bed control and ED admitting and pt finally transferred in and orders acknowledged.

## 2017-02-09 NOTE — Progress Notes (Signed)
Pt arrived to room 1517. Alert and oriented x4. Sitter at bedside. Belongings bag placed in pt storage, contained shirt and bra. Right arm SL in place. Oriented to room, call bell, sitter and precautions. Pt resting quietly. Will continue to monitor.

## 2017-02-09 NOTE — Consult Note (Signed)
[facility] Face-to-Face Psychiatry Consult   Reason for Consult:  Depression  Referring Physician:  Dr. Sheran Fava  Patient Identification: Katherine Morse MRN:  [id number] Principal Diagnosis: History of Bipolar Disorder   Diagnosis:   Patient Active Problem List   Diagnosis Date Noted  . Sepsis ([place]) [A41.9] 02/08/2017  . Influenza A [J10.1] 02/08/2017  . Convulsions/seizures ([place]) [R56.9] 02/07/2017  . Essential hypertension [I10] 02/07/2017  . Schizoaffective disorder, depressive type ([place]) [F25.1] 02/06/2017    Total Time spent with patient: 30 minutes  Subjective:   Katherine Morse is a 46 y.o. female patient admitted with depression, suicidal ideations .  HPI:  Patient is a 46 year old female, who presented to ED on 02/06/17 due to worsening depression and suicidal ideations. Patient was initially considered for [facility] admission but was transferred to ED due to fever, myalgias, malaise, and hypotension. Work up was positive for influenza, CX ray negative .Admitted to medical unit.   Patient reports she has been feeling more depressed recently, partly due to February being a difficult time of year for her, as it is anniversary of death of a boyfriend several years ago, and of one of her sons in 2005. She also states her currently living situation is " not good". States " she uses drugs, and she is always asking me for money".  She states she had been having some suicidal ideations recently, without specific plan or intention, but denies any active SI at this time. Denies any current hallucinations, and does not appear internally preoccupied.  She states she has been diagnosed with Bipolar Disorder, and has responded well to combination of Seroquel and Cymbalta, but has not been taking medications recently.  At this time patient remains physically ill, with malaise, myalgias,  dry cough, no shortness of breath at room air .  Past Psychiatric History: As above, reports prior history of Bipolar Disorder .  Reports worsening depression over recent days to weeks. States that she has had prior suicidal ideations in the past, but at this time does not endorse history of suicidal attempts. States she has a history of good response and tolerance to Seroquel/Cymbalta but had been off her psychiatric medications recently.  She denies drug or alcohol abuse   Risk to Self: Is patient at risk for suicide?: Yes Risk to Others:   Prior Inpatient Therapy:   Prior Outpatient Therapy:    Past Medical History:  Past Medical History:  Diagnosis Date  . Anxiety   . Bipolar 1 disorder ([place])   . Depression   . Hypertension   . Schizophrenia ([place])   . Seizures ([place])     Past Surgical History:  Procedure Laterality Date  . ABLATION    . CESAREAN SECTION     Family History:  Family History  Problem Relation Age of Onset  . Hypertension Mother   . Mental illness Son    Family Psychiatric  History: non contributory  Social History:  History  Alcohol Use No     History  Drug Use No    Social History   Social History  . Marital status: Single    Spouse name: N/A  . Number of children: N/A  . Years of education: N/A   Social History Main Topics  . Smoking status: Former Research scientist (life sciences)  . Smokeless tobacco: Never Used  . Alcohol use No  . Drug use: No  . Sexual activity: Not Asked   Other Topics Concern  . None   Social History Narrative  .  None   Additional Social History:    Allergies:   Allergies  Allergen Reactions  . Morphine   . Other   . Compazine [Prochlorperazine Edisylate] Anxiety  . Morphine And Related Anxiety  . Penicillins Anxiety and Other (See Comments)    Has patient had a PCN reaction causing immediate rash, facial/tongue/throat swelling, SOB or lightheadedness with hypotension: No Has patient had a PCN reaction causing severe rash involving mucus membranes or skin necrosis: No  Has patient had a PCN reaction that required hospitalization: No  Has patient had a PCN  reaction occurring within the last 10 years: No  If all of the above answers are "NO", then may proceed with Cephalosporin use.   Marland Kitchen Vistaril [Hydroxyzine Hcl] Anxiety    Labs:  Results for orders placed or performed during the hospital encounter of 02/09/17 (from the past 48 hour(s))  Lactic acid, plasma     Status: None   Collection Time: 02/09/17  5:31 AM  Result Value Ref Range   Lactic Acid, Venous 0.5 0.5 - 1.9 mmol/L  Procalcitonin     Status: None   Collection Time: 02/09/17  5:31 AM  Result Value Ref Range   Procalcitonin <0.10 ng/mL    Comment:        Interpretation: PCT (Procalcitonin) <= 0.5 ng/mL: Systemic infection (sepsis) is not likely. Local bacterial infection is possible. (NOTE)         ICU PCT Algorithm               Non ICU PCT Algorithm    ----------------------------     ------------------------------         PCT < 0.25 ng/mL                 PCT < 0.1 ng/mL     Stopping of antibiotics            Stopping of antibiotics       strongly encouraged.               strongly encouraged.    ----------------------------     ------------------------------       PCT level decrease by               PCT < 0.25 ng/mL       >= 80% from peak PCT       OR PCT 0.25 - 0.5 ng/mL          Stopping of antibiotics                                             encouraged.     Stopping of antibiotics           encouraged.    ----------------------------     ------------------------------       PCT level decrease by              PCT >= 0.25 ng/mL       < 80% from peak PCT        AND PCT >= 0.5 ng/mL            Continuin g antibiotics  encouraged.       Continuing antibiotics            encouraged.    ----------------------------     ------------------------------     PCT level increase compared          PCT > 0.5 ng/mL         with peak PCT AND          PCT >= 0.5 ng/mL             Escalation of antibiotics                                           strongly encouraged.      Escalation of antibiotics        strongly encouraged.   Magnesium     Status: None   Collection Time: 02/09/17  5:31 AM  Result Value Ref Range   Magnesium 1.8 1.7 - 2.4 mg/dL  Phosphorus     Status: None   Collection Time: 02/09/17  5:31 AM  Result Value Ref Range   Phosphorus 3.3 2.5 - 4.6 mg/dL  TSH     Status: Abnormal   Collection Time: 02/09/17  5:31 AM  Result Value Ref Range   TSH 0.223 (L) 0.350 - 4.500 uIU/mL    Comment: Performed by a 3rd Generation assay with a functional sensitivity of <=0.01 uIU/mL.  Comprehensive metabolic panel     Status: Abnormal   Collection Time: 02/09/17  5:31 AM  Result Value Ref Range   Sodium 135 135 - 145 mmol/L   Potassium 3.6 3.5 - 5.1 mmol/L   Chloride 106 101 - 111 mmol/L   CO2 22 22 - 32 mmol/L   Glucose, Bld 89 65 - 99 mg/dL   BUN 11 6 - 20 mg/dL   Creatinine, Ser 0.64 0.44 - 1.00 mg/dL   Calcium 8.3 (L) 8.9 - 10.3 mg/dL   Total Protein 6.7 6.5 - 8.1 g/dL   Albumin 3.4 (L) 3.5 - 5.0 g/dL   AST 20 15 - 41 U/L   ALT 14 14 - 54 U/L   Alkaline Phosphatase 67 38 - 126 U/L   Total Bilirubin 0.4 0.3 - 1.2 mg/dL   GFR calc non Af Amer >60 >60 mL/min   GFR calc Af Amer >60 >60 mL/min    Comment: (NOTE) The eGFR has been calculated using the CKD EPI equation. This calculation has not been validated in all clinical situations. eGFR's persistently <60 mL/min signify possible Chronic Kidney Disease.    Anion gap 7 5 - 15  CBC     Status: Abnormal   Collection Time: 02/09/17  5:31 AM  Result Value Ref Range   WBC 5.1 4.0 - 10.5 K/uL   RBC 3.66 (L) 3.87 - 5.11 MIL/uL   Hemoglobin 10.3 (L) 12.0 - 15.0 g/dL   HCT 31.9 (L) 36.0 - 46.0 %   MCV 87.2 78.0 - 100.0 fL   MCH 28.1 26.0 - 34.0 pg   MCHC 32.3 30.0 - 36.0 g/dL   RDW 14.4 11.5 - 15.5 %   Platelets 192 150 - 400 K/uL  Lactic acid, plasma     Status: None   Collection Time: 02/09/17  8:24 AM  Result Value Ref Range   Lactic Acid, Venous  0.7 0.5 - 1.9 mmol/L  T4, free     Status:  None   Collection Time: 02/09/17  8:24 AM  Result Value Ref Range   Free T4 0.92 0.61 - 1.12 ng/dL    Comment: (NOTE) Biotin ingestion may interfere with free T4 tests. If the results are inconsistent with the TSH level, previous test results, or the clinical presentation, then consider biotin interference. If needed, order repeat testing after stopping biotin. Performed at [facility], [place] [address], [place], [place] [address]     Current Facility-Administered Medications  Medication Dose Route Frequency Provider Last Rate Last Dose  . acetaminophen (TYLENOL) tablet 650 mg  650 mg Oral Q6H PRN Toy Baker, MD       Or  . acetaminophen (TYLENOL) suppository 650 mg  650 mg Rectal Q6H PRN Toy Baker, MD      . albuterol (PROVENTIL) (2.5 MG/3ML) 0.083% nebulizer solution 2.5 mg  2.5 mg Nebulization Q2H PRN Toy Baker, MD      . amitriptyline (ELAVIL) tablet 50 mg  50 mg Oral QHS Toy Baker, MD      . DULoxetine (CYMBALTA) DR capsule 60 mg  60 mg Oral BID Toy Baker, MD   60 mg at 02/09/17 1032  . enoxaparin (LOVENOX) injection 60 mg  60 mg Subcutaneous Daily Toy Baker, MD   60 mg at 02/09/17 1034  . gabapentin (NEURONTIN) capsule 300 mg  300 mg Oral TID Toy Baker, MD   300 mg at 02/09/17 1527  . guaiFENesin (MUCINEX) 12 hr tablet 600 mg  600 mg Oral BID Toy Baker, MD   600 mg at 02/09/17 1035  . guaiFENesin-dextromethorphan (ROBITUSSIN DM) 100-10 MG/5ML syrup 5 mL  5 mL Oral Q4H PRN Janece Canterbury, MD   5 mL at 02/09/17 1440  . HYDROcodone-acetaminophen (NORCO/VICODIN) 5-325 MG per tablet 1-2 tablet  1-2 tablet Oral Q4H PRN Toy Baker, MD   2 tablet at 02/09/17 1114  . ondansetron (ZOFRAN) tablet 4 mg  4 mg Oral Q6H PRN Toy Baker, MD       Or  . ondansetron (ZOFRAN) injection 4 mg  4 mg Intravenous Q6H PRN Toy Baker, MD      . oseltamivir  (TAMIFLU) capsule 75 mg  75 mg Oral BID Toy Baker, MD   75 mg at 02/09/17 1035  . phenytoin (DILANTIN) ER capsule 100 mg  100 mg Oral BID Toy Baker, MD   100 mg at 02/09/17 1035  . QUEtiapine (SEROQUEL) tablet 300 mg  300 mg Oral QHS Toy Baker, MD        Musculoskeletal: Strength & Muscle Tone: within normal limits Gait & Station: gait not examined  Patient leans: N/A  Psychiatric Specialty Exam: Physical Exam  ROS malaise, fever, myalgias   Blood pressure 117/66, pulse (!) 103, temperature 100.1 F (37.8 C), temperature source Oral, resp. rate 20, height _0  (1.575 m), weight 114 kg (251 lb 5.2 oz), SpO2 93 %.Body mass index is 45.97 kg/m.  General Appearance: Fairly Groomed  Eye Contact:  Good  Speech:  Normal Rate  Volume:  Normal  Mood:  Depressed  Affect:  constricted, but smiles briefly at times   Thought Process:  Linear  Orientation:  Full (Time, Place, and Person)  Thought Content:  denies hallucinations at this time and does not appear internally preoccupied   Suicidal Thoughts:  describes recent suicidal ideations without plan or intention, at this time contracts for safety, denies current SI  Homicidal Thoughts:  currently denies active HI- chart notes indicate she has had intermittent  HI towards roommate, but currently does not endorse   Memory:  recent and remote grossly intact   Judgement:  Fair  Insight:  Fair  Psychomotor Activity:  Decreased  Concentration:  Concentration: Good and Attention Span: Good  Recall:  Good  Fund of Knowledge:  Good  Language:  Good  Akathisia:  Negative  Handed:  Right  AIMS (if indicated):     Assets:  Desire for Improvement Resilience  ADL's:  Fair   Cognition:  WNL  Sleep:        Treatment Plan Summary:   Disposition: No evidence of imminent risk to self or others at present.   Recommend psychiatric Inpatient admission when medically cleared. * Patient is expressing [facility]  once medically stabilized/cleared , in order to continue focusing on management of her deteriorating mood disorder/depression.  As patient was not taking her psychiatric medications recently, would restart Cymbalta/ Seroquel/Neurontin at lower doses Decrease Cymbalta to 40 mgrs QDAY for depression Decrease Seroquel to 100 mgrs QHS  Decrease Neurontin to 100 mgrs TID  *Would note that Phenytoin may decrease Cymbalta and Seroquel serum levels , but would titrate these meds gradually to minimize risk of side effects  Would D/C Elavil, due to potential interactions with Cymbalta, side effect profile, and potential increased sedation in association with Seroquel.  Consider repeating EKG to rule out QTc prolongation     Neita Garnet, MD 02/09/2017 5:30 PM

## 2017-02-09 NOTE — Progress Notes (Addendum)
Pharmacy Antibiotic Note  Katherine Morse is a 46 y.o. female c/o depression admitted on 02/09/2017 with pneumonia.  Pharmacy has been consulted for rocephin/zmax dosing.  Plan: Rocephin 1 Gm IV q24h Zmax 500 mg IV q24h Rx adjusted Lovenox to 60 mg daily for BMI=45 Rx will sign off as no further adjustments necessary     Temp (24hrs), Avg:99.8 F (37.7 C), Min:98.5 F (36.9 C), Max:100.5 F (38.1 C)   Recent Labs Lab 02/05/17 2305 02/08/17 1912 02/08/17 1925  WBC 9.3 8.2  --   CREATININE 0.70 0.68  --   LATICACIDVEN  --   --  0.92    Estimated Creatinine Clearance: 105.4 mL/min (by C-G formula based on SCr of 0.68 mg/dL).    Allergies  Allergen Reactions  . Morphine   . Other   . Compazine [Prochlorperazine Edisylate] Anxiety  . Morphine And Related Anxiety  . Penicillins Anxiety and Other (See Comments)    Has patient had a PCN reaction causing immediate rash, facial/tongue/throat swelling, SOB or lightheadedness with hypotension: No Has patient had a PCN reaction causing severe rash involving mucus membranes or skin necrosis: No  Has patient had a PCN reaction that required hospitalization: No  Has patient had a PCN reaction occurring within the last 10 years: No  If all of the above answers are "NO", then may proceed with Cephalosporin use.   Dennie Maizes. Vistaril [Hydroxyzine Hcl] Anxiety    Antimicrobials this admission: 2/10 zosyn >> x1 ED 2/10 tamiflu >>  2/11 rocephin >> 2/11 zmax >>  Dose adjustments this admission:   Microbiology results:  BCx:   UCx:    Sputum:    MRSA PCR:   Thank you for allowing pharmacy to be a part of this patient's care.  Lorenza EvangelistGreen, Ebon Ketchum R 02/09/2017 3:38 AM

## 2017-02-09 NOTE — Progress Notes (Addendum)
PROGRESS NOTE  Katherine Morse Sweaney  NWG:[id number] DOB: 1971-05-04 DOA: 02/09/2017 PCP: No PCP Per Patient  Brief Narrative:   Katherine Morse Oommen is a 46 y.o. female with medical history significant of Bipolar, h/o depression, anxiety, schizophrenia.  She initially presented with 2 weeks of depression with associated suicidal ideation and was brought to the ER by [employer]. Patient initially was admitted to behavioral health but developed fever, cough, myalgias, and eventually became hypotensive.  She was transferred to the ER where she was hypotensive to the 80s systolic and febrile.  Flu PCR was positive. CXR negative.  She was given IVF and tamiflu and admitted to the hospital.  She continues to feel poorly today with chills, myalgias, severe fatigue, and cough.  Assessment & Plan:   Active Problems:   Schizoaffective disorder, depressive type (HCC)   Convulsions/seizures (HCC)   Essential hypertension   Sepsis (HCC)   Influenza A  Sepsis secondary to influenza, BP better but still tachycardic -  Continue IVF -  Continue tamiflu -  D/c antibiotics  Essential hypertension -  Continue to hold antihypertensives  Suicidal ideation  -  Sitter -  Psychiatry to follow in hospital  Schizoaffective disorder -  Continue amitriptyline, duloxetine, seroquel  Seizure disorder, seen in ER in late January for seizure -  Continue phenytoin -  Check phenytoin level in AM  Sick euthyroid, fT4 wnl -  Repeat TFTs in 4 weeks  DVT prophylaxis:  lovenox Code Status:  full Family Communication:  Patient alone Disposition Plan:  Pending blood pressures stable for at least 24 hours, tachycardia resolved, cultures negative for at least 24-48 hours   Consultants:   Psychiatry  Procedures:  none  Antimicrobials:  Anti-infectives    Start     Dose/Rate Route Frequency Ordered Stop   02/09/17 1000  oseltamivir (TAMIFLU) capsule 75 mg     75 mg Oral 2 times daily 02/09/17 0330 02/14/17 0959   02/09/17  0500  azithromycin (ZITHROMAX) 500 mg in dextrose 5 % 250 mL IVPB  Status:  Discontinued     500 mg 250 mL/hr over 60 Minutes Intravenous Every 24 hours 02/09/17 0330 02/09/17 0735   02/09/17 0400  cefTRIAXone (ROCEPHIN) 1 g in dextrose 5 % 50 mL IVPB  Status:  Discontinued     1 g 100 mL/hr over 30 Minutes Intravenous Every 24 hours 02/09/17 0330 02/09/17 0735       Subjective: Still feeling very tired, chills and having a bad cough with some SOB  Objective: Vitals:   02/09/17 0538 02/09/17 0600 02/09/17 0807 02/09/17 1500  BP:   100/61 117/66  Pulse:   (!) 116 (!) 103  Resp:   20 20  Temp: 98.9 F (37.2 C)  99.5 F (37.5 C) 100.1 F (37.8 C)  TempSrc: Oral  Oral Oral  SpO2:   94% 93%  Weight:  114 kg (251 lb 5.2 oz)    Height:  5\' 2"  (1.575 m)      Intake/Output Summary (Last 24 hours) at 02/09/17 1551 Last data filed at 02/09/17 0811  Gross per 24 hour  Intake           666.67 ml  Output                0 ml  Net           666.67 ml   Filed Weights   02/09/17 0600  Weight: 114 kg (251 lb 5.2 oz)    Examination:  General exam:  Adult female.  Lying in bed, eyes closed under several blankets.  Feels febrile on exam HEENT:  NCAT, MMM Respiratory system:  Scattered rales bilaterally Cardiovascular system: tachycardic, regular rhythm, normal S1/S2. No murmurs, rubs, gallops or clicks.  Warm extremities Gastrointestinal system: Normal active bowel sounds, soft, nondistended, nontender. MSK:  Normal tone and bulk, no lower extremity edema Neuro:  Grossly moves all extremities    Data Reviewed: I have personally reviewed following labs and imaging studies  CBC:  Recent Labs Lab 02/05/17 2305 02/08/17 1912 02/09/17 0531  WBC 9.3 8.2 5.1  NEUTROABS  --  7.4  --   HGB 12.1 12.5 10.3*  HCT 37.4 37.8 31.9*  MCV 88.0 85.5 87.2  PLT 269 221 192   Basic Metabolic Panel:  Recent Labs Lab 02/05/17 2305 02/08/17 1912 02/09/17 0531  NA 141 134* 135  K 3.7  3.6 3.6  CL 110 103 106  CO2 24 23 22   GLUCOSE 98 91 89  BUN 17 18 11   CREATININE 0.70 0.68 0.64  CALCIUM 9.3 9.2 8.3*  MG  --   --  1.8  PHOS  --   --  3.3   GFR: Estimated Creatinine Clearance: 105 mL/min (by C-G formula based on SCr of 0.64 mg/dL). Liver Function Tests:  Recent Labs Lab 02/05/17 2305 02/08/17 1912 02/09/17 0531  AST 22 20 20   ALT 15 15 14   ALKPHOS 86 84 67  BILITOT 0.3 0.7 0.4  PROT 8.3* 8.2* 6.7  ALBUMIN 4.4 4.2 3.4*   No results for input(s): LIPASE, AMYLASE in the last 168 hours. No results for input(s): AMMONIA in the last 168 hours. Coagulation Profile: No results for input(s): INR, PROTIME in the last 168 hours. Cardiac Enzymes: No results for input(s): CKTOTAL, CKMB, CKMBINDEX, TROPONINI in the last 168 hours. BNP (last 3 results) No results for input(s): PROBNP in the last 8760 hours. HbA1C:  Recent Labs  02/08/17 0606  HGBA1C 4.4*   CBG: No results for input(s): GLUCAP in the last 168 hours. Lipid Profile:  Recent Labs  02/08/17 0606  CHOL 206*  HDL 63  LDLCALC 126*  TRIG 84  CHOLHDL 3.3   Thyroid Function Tests:  Recent Labs  02/09/17 0531 02/09/17 0824  TSH 0.223*  --   FREET4  --  0.92   Anemia Panel: No results for input(s): VITAMINB12, FOLATE, FERRITIN, TIBC, IRON, RETICCTPCT in the last 72 hours. Urine analysis:    Component Value Date/Time   COLORURINE YELLOW 02/09/2017 0139   APPEARANCEUR CLEAR 02/09/2017 0139   LABSPEC 1.019 02/09/2017 0139   PHURINE 5.0 02/09/2017 0139   GLUCOSEU NEGATIVE 02/09/2017 0139   HGBUR NEGATIVE 02/09/2017 0139   BILIRUBINUR NEGATIVE 02/09/2017 0139   KETONESUR 20 (A) 02/09/2017 0139   PROTEINUR NEGATIVE 02/09/2017 0139   NITRITE NEGATIVE 02/09/2017 0139   LEUKOCYTESUR NEGATIVE 02/09/2017 0139   Sepsis Labs: @LABRCNTIP (procalcitonin:4,lacticidven:4)  )No results found for this or any previous visit (from the past 240 hour(s)).    Radiology Studies: Dg Chest 2  View  Result Date: 02/08/2017 CLINICAL DATA:  Acute onset of hypotension. Positive for influenza. Initial encounter. EXAM: CHEST  2 VIEW COMPARISON:  Chest radiograph performed 09/01/2016 FINDINGS: The lungs are well-aerated. Pulmonary vascularity is at the upper limits of normal. There is no evidence of focal opacification, pleural effusion or pneumothorax. The heart is borderline normal in size. There is apparent tortuosity of the thoracic aorta. No acute osseous abnormalities are seen. Mild chronic compression  deformities are again noted along the lower thoracic spine. IMPRESSION: No acute cardiopulmonary process seen. Electronically Signed   By: Roanna Raider M.D.   On: 02/08/2017 18:59     Scheduled Meds: . amitriptyline  50 mg Oral QHS  . DULoxetine  60 mg Oral BID  . enoxaparin (LOVENOX) injection  60 mg Subcutaneous Daily  . gabapentin  300 mg Oral TID  . guaiFENesin  600 mg Oral BID  . oseltamivir  75 mg Oral BID  . phenytoin  100 mg Oral BID  . QUEtiapine  300 mg Oral QHS   Continuous Infusions:   LOS: 0 days    Time spent: 30 min    Renae Fickle, MD [facility] Pager [phone]  If 7PM-7AM, please contact night-coverage [email or web address] Password [facility] 02/09/2017, 3:51 PM

## 2017-02-09 NOTE — Progress Notes (Signed)
Unable to transfer pt over to [facility]. Contacted Bed control and they will handle it.

## 2017-02-10 DIAGNOSIS — Z888 Allergy status to other drugs, medicaments and biological substances status: Secondary | ICD-10-CM | POA: Diagnosis not present

## 2017-02-10 DIAGNOSIS — R569 Unspecified convulsions: Secondary | ICD-10-CM | POA: Diagnosis not present

## 2017-02-10 DIAGNOSIS — E0781 Sick-euthyroid syndrome: Secondary | ICD-10-CM | POA: Diagnosis present

## 2017-02-10 DIAGNOSIS — F251 Schizoaffective disorder, depressive type: Secondary | ICD-10-CM | POA: Diagnosis present

## 2017-02-10 DIAGNOSIS — R45851 Suicidal ideations: Secondary | ICD-10-CM | POA: Diagnosis present

## 2017-02-10 DIAGNOSIS — D649 Anemia, unspecified: Secondary | ICD-10-CM | POA: Diagnosis present

## 2017-02-10 DIAGNOSIS — F4310 Post-traumatic stress disorder, unspecified: Secondary | ICD-10-CM | POA: Diagnosis not present

## 2017-02-10 DIAGNOSIS — I959 Hypotension, unspecified: Secondary | ICD-10-CM | POA: Diagnosis present

## 2017-02-10 DIAGNOSIS — I10 Essential (primary) hypertension: Secondary | ICD-10-CM | POA: Diagnosis present

## 2017-02-10 DIAGNOSIS — F419 Anxiety disorder, unspecified: Secondary | ICD-10-CM | POA: Diagnosis present

## 2017-02-10 DIAGNOSIS — J101 Influenza due to other identified influenza virus with other respiratory manifestations: Secondary | ICD-10-CM | POA: Diagnosis present

## 2017-02-10 DIAGNOSIS — A419 Sepsis, unspecified organism: Secondary | ICD-10-CM | POA: Diagnosis present

## 2017-02-10 DIAGNOSIS — G40909 Epilepsy, unspecified, not intractable, without status epilepticus: Secondary | ICD-10-CM | POA: Diagnosis present

## 2017-02-10 DIAGNOSIS — Z8249 Family history of ischemic heart disease and other diseases of the circulatory system: Secondary | ICD-10-CM | POA: Diagnosis not present

## 2017-02-10 DIAGNOSIS — Z880 Allergy status to penicillin: Secondary | ICD-10-CM | POA: Diagnosis not present

## 2017-02-10 DIAGNOSIS — Z79899 Other long term (current) drug therapy: Secondary | ICD-10-CM | POA: Diagnosis not present

## 2017-02-10 DIAGNOSIS — Z811 Family history of alcohol abuse and dependence: Secondary | ICD-10-CM | POA: Diagnosis not present

## 2017-02-10 DIAGNOSIS — Z818 Family history of other mental and behavioral disorders: Secondary | ICD-10-CM | POA: Diagnosis not present

## 2017-02-10 DIAGNOSIS — Z87891 Personal history of nicotine dependence: Secondary | ICD-10-CM | POA: Diagnosis not present

## 2017-02-10 LAB — CBC
HCT: 33.2 % — ABNORMAL LOW (ref 36.0–46.0)
Hemoglobin: 10.7 g/dL — ABNORMAL LOW (ref 12.0–15.0)
MCH: 28.1 pg (ref 26.0–34.0)
MCHC: 32.2 g/dL (ref 30.0–36.0)
MCV: 87.1 fL (ref 78.0–100.0)
PLATELETS: 175 10*3/uL (ref 150–400)
RBC: 3.81 MIL/uL — ABNORMAL LOW (ref 3.87–5.11)
RDW: 14.4 % (ref 11.5–15.5)
WBC: 4.3 10*3/uL (ref 4.0–10.5)

## 2017-02-10 LAB — BASIC METABOLIC PANEL
Anion gap: 7 (ref 5–15)
BUN: 6 mg/dL (ref 6–20)
CALCIUM: 8.6 mg/dL — AB (ref 8.9–10.3)
CO2: 24 mmol/L (ref 22–32)
CREATININE: 0.65 mg/dL (ref 0.44–1.00)
Chloride: 107 mmol/L (ref 101–111)
GFR calc Af Amer: >60 mL/min (ref >60)
GFR calc non Af Amer: >60 mL/min (ref >60)
GLUCOSE: 77 mg/dL (ref 65–99)
Potassium: 3.5 mmol/L (ref 3.5–5.1)
Sodium: 138 mmol/L (ref 135–145)

## 2017-02-10 LAB — PHENYTOIN LEVEL, TOTAL

## 2017-02-10 MED ORDER — PROMETHAZINE HCL 25 MG/ML IJ SOLN: 12.5000 mg | Freq: Four times a day (QID) | INTRAMUSCULAR | Status: DC | PRN

## 2017-02-10 MED ORDER — SODIUM CHLORIDE 0.9 % IV SOLN
8.0000 mg | Freq: Three times a day (TID) | INTRAVENOUS | Status: AC
  Administered 2017-02-10 – 2017-02-11 (×3): 8 mg via INTRAVENOUS
  Filled 2017-02-10 (×3): qty 4

## 2017-02-10 MED ORDER — IBUPROFEN 200 MG PO TABS
200.0000 mg | ORAL_TABLET | ORAL | Status: DC | PRN
  Administered 2017-02-10: 200 mg via ORAL
  Filled 2017-02-10: qty 1

## 2017-02-10 NOTE — Progress Notes (Signed)
PROGRESS NOTE  Katherine Morse  AVW:[id number] DOB: 12-Jan-1971 DOA: 02/09/2017 PCP: No PCP Per Patient  Brief Narrative:   Katherine Morse is a 46 y.o. female with medical history significant of Bipolar, h/o depression, anxiety, schizophrenia.  She initially presented with 2 weeks of depression with associated suicidal ideation and was brought to the ER by [employer]. Patient initially was admitted to behavioral health but developed fever, cough, myalgias, and eventually became hypotensive.  She was transferred to the ER where she was hypotensive to the 80s systolic and febrile.  Flu PCR was positive. CXR negative.  She was given IVF and tamiflu and admitted to the hospital.  She continues to feel poorly today with chills, myalgias, severe fatigue, and cough.  Assessment & Plan:   Active Problems:   Schizoaffective disorder, depressive type (HCC)   Convulsions/seizures (HCC)   Essential hypertension   Sepsis (HCC)   Influenza A  Sepsis secondary to influenza, BP better but still tachycardic and intermittently febrile.  Also having associated nausea with poor oral intake.   -  Continue tamiflu -  Blood cultures NGTD -  Schedule zofran -  Add prn phenergan  Essential hypertension -  Continue to hold antihypertensives  Suicidal ideation  -  Sitter -  Psychiatry to follow in hospital  Schizoaffective disorder -  Appreciate psychiatry assistance -  Discontinued amitriptyline -  Decreased dose of duloxetine, seroquel  Seizure disorder, seen in ER in late January for seizure -  Continue phenytoin -  Phenytoin level undetectable  Sick euthyroid, fT4 wnl -  Repeat TFTs in 4 weeks  Normocytic anemia, no signs of hemorrhage, likely due to viral illness -  Repeat hemoglobin as outpatient   DVT prophylaxis:  lovenox Code Status:  full Family Communication:  Patient alone Disposition Plan:  Pending blood pressures stable for at least 24 hours, tachycardia resolved, cultures negative for at  least 24-48 hours.  May be ready for discharge on 2/13.     Consultants:   Psychiatry  Procedures:  none  Antimicrobials:  Anti-infectives    Start     Dose/Rate Route Frequency Ordered Stop   02/09/17 1000  oseltamivir (TAMIFLU) capsule 75 mg     75 mg Oral 2 times daily 02/09/17 0330 02/14/17 0959   02/09/17 0500  azithromycin (ZITHROMAX) 500 mg in dextrose 5 % 250 mL IVPB  Status:  Discontinued     500 mg 250 mL/hr over 60 Minutes Intravenous Every 24 hours 02/09/17 0330 02/09/17 0735   02/09/17 0400  cefTRIAXone (ROCEPHIN) 1 g in dextrose 5 % 50 mL IVPB  Status:  Discontinued     1 g 100 mL/hr over 30 Minutes Intravenous Every 24 hours 02/09/17 0330 02/09/17 0735       Subjective: Feeling a little better today, but not hungry and not able to eat much because of nausea.  Still having chills, myalgias.    Objective: Vitals:   02/09/17 0807 02/09/17 1500 02/09/17 1751 02/09/17 2053  BP: 100/61 117/66  119/63  Pulse: (!) 116 (!) 103  92  Resp: 20 20  20   Temp: 99.5 F (37.5 C) 100.1 F (37.8 C)  100.1 F (37.8 C)  TempSrc: Oral Oral  Oral  SpO2: 94% 93% 96% 94%  Weight:      Height:        Intake/Output Summary (Last 24 hours) at 02/10/17 1431 Last data filed at 02/10/17 1410  Gross per 24 hour  Intake  514 ml  Output                0 ml  Net              514 ml   Filed Weights   02/09/17 0600  Weight: 114 kg (251 lb 5.2 oz)    Examination:  General exam:  Adult female.  Lying in bed, no acute distress HEENT:  NCAT, MMM Respiratory system:  Persistent scattered rales bilaterally Cardiovascular system: tachycardic, regular rhythm, normal S1/S2. No murmurs, rubs, gallops or clicks.  Warm extremities Gastrointestinal system: Normal active bowel sounds, soft, nondistended, nontender. MSK:  Normal tone and bulk, no lower extremity edema Neuro:  Grossly moves all extremities    Data Reviewed: I have personally reviewed following labs and  imaging studies  CBC:  Recent Labs Lab 02/05/17 2305 02/08/17 1912 02/09/17 0531 02/10/17 0455  WBC 9.3 8.2 5.1 4.3  NEUTROABS  --  7.4  --   --   HGB 12.1 12.5 10.3* 10.7*  HCT 37.4 37.8 31.9* 33.2*  MCV 88.0 85.5 87.2 87.1  PLT 269 221 192 175   Basic Metabolic Panel:  Recent Labs Lab 02/05/17 2305 02/08/17 1912 02/09/17 0531 02/10/17 0455  NA 141 134* 135 138  K 3.7 3.6 3.6 3.5  CL 110 103 106 107  CO2 24 23 22 24   GLUCOSE 98 91 89 77  BUN 17 18 11 6   CREATININE 0.70 0.68 0.64 0.65  CALCIUM 9.3 9.2 8.3* 8.6*  MG  --   --  1.8  --   PHOS  --   --  3.3  --    GFR: Estimated Creatinine Clearance: 105 mL/min (by C-G formula based on SCr of 0.65 mg/dL). Liver Function Tests:  Recent Labs Lab 02/05/17 2305 02/08/17 1912 02/09/17 0531  AST 22 20 20   ALT 15 15 14   ALKPHOS 86 84 67  BILITOT 0.3 0.7 0.4  PROT 8.3* 8.2* 6.7  ALBUMIN 4.4 4.2 3.4*   No results for input(s): LIPASE, AMYLASE in the last 168 hours. No results for input(s): AMMONIA in the last 168 hours. Coagulation Profile: No results for input(s): INR, PROTIME in the last 168 hours. Cardiac Enzymes: No results for input(s): CKTOTAL, CKMB, CKMBINDEX, TROPONINI in the last 168 hours. BNP (last 3 results) No results for input(s): PROBNP in the last 8760 hours. HbA1C:  Recent Labs  02/08/17 0606  HGBA1C 4.4*   CBG: No results for input(s): GLUCAP in the last 168 hours. Lipid Profile:  Recent Labs  02/08/17 0606  CHOL 206*  HDL 63  LDLCALC 126*  TRIG 84  CHOLHDL 3.3   Thyroid Function Tests:  Recent Labs  02/09/17 0531 02/09/17 0824  TSH 0.223*  --   FREET4  --  0.92   Anemia Panel: No results for input(s): VITAMINB12, FOLATE, FERRITIN, TIBC, IRON, RETICCTPCT in the last 72 hours. Urine analysis:    Component Value Date/Time   COLORURINE YELLOW 02/09/2017 0139   APPEARANCEUR CLEAR 02/09/2017 0139   LABSPEC 1.019 02/09/2017 0139   PHURINE 5.0 02/09/2017 0139   GLUCOSEU  NEGATIVE 02/09/2017 0139   HGBUR NEGATIVE 02/09/2017 0139   BILIRUBINUR NEGATIVE 02/09/2017 0139   KETONESUR 20 (A) 02/09/2017 0139   PROTEINUR NEGATIVE 02/09/2017 0139   NITRITE NEGATIVE 02/09/2017 0139   LEUKOCYTESUR NEGATIVE 02/09/2017 0139   Sepsis Labs: @LABRCNTIP (procalcitonin:4,lacticidven:4)  ) Recent Results (from the past 240 hour(s))  Culture, blood (x 2)     Status: None (Preliminary  result)   Collection Time: 02/09/17  5:33 AM  Result Value Ref Range Status   Specimen Description BLOOD RIGHT ANTECUBITAL  Final   Special Requests BOTTLES DRAWN AEROBIC ONLY 4CC  Final   Culture   Final    NO GROWTH 1 DAY Performed at [facility], [address] [place], [place] [address]    Report Status PENDING  Incomplete  Culture, blood (x 2)     Status: None (Preliminary result)   Collection Time: 02/09/17  5:33 AM  Result Value Ref Range Status   Specimen Description BLOOD RIGHT HAND  Final   Special Requests IN PEDIATRIC BOTTLE 2CC  Final   Culture   Final    NO GROWTH 1 DAY Performed at [facility], [address] [place], [place] [address]    Report Status PENDING  Incomplete      Radiology Studies: Dg Chest 2 View  Result Date: 02/08/2017 CLINICAL DATA:  Acute onset of hypotension. Positive for influenza. Initial encounter. EXAM: CHEST  2 VIEW COMPARISON:  Chest radiograph performed 09/01/2016 FINDINGS: The lungs are well-aerated. Pulmonary vascularity is at the upper limits of normal. There is no evidence of focal opacification, pleural effusion or pneumothorax. The heart is borderline normal in size. There is apparent tortuosity of the thoracic aorta. No acute osseous abnormalities are seen. Mild chronic compression deformities are again noted along the lower thoracic spine. IMPRESSION: No acute cardiopulmonary process seen. Electronically Signed   By: Roanna RaiderJeffery  Chang M.D.   On: 02/08/2017 18:59     Scheduled Meds: . DULoxetine  40 mg Oral Daily  .  enoxaparin (LOVENOX) injection  60 mg Subcutaneous Daily  . gabapentin  100 mg Oral TID  . guaiFENesin  600 mg Oral BID  . ondansetron (ZOFRAN) IV  8 mg Intravenous Q8H  . oseltamivir  75 mg Oral BID  . phenytoin  100 mg Oral BID  . QUEtiapine  100 mg Oral QHS   Continuous Infusions:   LOS: 0 days    Time spent: 30 min    Renae FickleSHORT, Loye Vento, MD [facility] Pager [phone]  If 7PM-7AM, please contact night-coverage [email or web address] Password [email or web address] 02/10/2017, 2:31 PM

## 2017-02-10 NOTE — Clinical Social Work Psych Note (Addendum)
Clinical Social Worker Psych Service Line Progress Note  Clinical Social Worker: Lia Hopping, LCSW Date/Time: 02/10/2017, 2:46 PM   Review of Patient  Overall Medical Condition:  Medically stable Participation Level:  Active Participation Quality: Appropriate Other Participation Quality:  Calm, Pleasant   Affect: Appropriate Cognitive: Appropriate Reaction to Medications/Concerns:  No concerns presented.   Modes of Intervention: Solution-focused, Support   Summary of Progress/Plan at Discharge  Summary of Progress/Plan at Discharge: LCSWA met with patient at bedside, and explained reason for intervention-to assist with patient discharge back to [facility]. Patient agreeablet to return once medically stable.  LCSWA inquired about patient wellbeing. She reports at this time she is not having SI/HI but is still very depressed. She reports the death anniversary of her son is tomorrow and its always hard for her around this time. Patient reports this is why she voluntarily went to [facility] for treatment.   Patient expressed concerns about her living situation,she reports she has been having difficulties living with a friend and seeking new housing. Patient reports the CSW at [facility] has been assisting with placement. LCSWA will continue to follow and assist with disposition.   Kathrin Greathouse, Latanya Presser, MSW Clinical Social Worker 5E and Psychiatric Service Line [phone] 02/10/2017  3:13 PM

## 2017-02-10 NOTE — Progress Notes (Signed)
Resting O2 saturation 99% on room air. Ambulating was ranging between 92%-98%.

## 2017-02-11 DIAGNOSIS — A419 Sepsis, unspecified organism: Principal | ICD-10-CM

## 2017-02-11 LAB — PHENYTOIN LEVEL, FREE AND TOTAL
Phenytoin, Free: NOT DETECTED ug/mL (ref 1.0–2.0)
Phenytoin, Total: 1.3 ug/mL — ABNORMAL LOW (ref 10.0–20.0)

## 2017-02-11 MED ORDER — OSELTAMIVIR PHOSPHATE 75 MG PO CAPS: 75.0000 mg | ORAL_CAPSULE | Freq: Two times a day (BID) | ORAL | 0 refills | Status: DC

## 2017-02-11 MED ORDER — GUAIFENESIN ER 600 MG PO TB12: 600.0000 mg | ORAL_TABLET | Freq: Two times a day (BID) | ORAL | 0 refills | Status: DC | PRN

## 2017-02-11 MED ORDER — IBUPROFEN 200 MG PO TABS: 200.0000 mg | ORAL_TABLET | ORAL | 0 refills | Status: DC | PRN

## 2017-02-11 MED ORDER — ONDANSETRON HCL 4 MG PO TABS: 4.0000 mg | ORAL_TABLET | Freq: Three times a day (TID) | ORAL | 0 refills | Status: DC | PRN

## 2017-02-11 MED ORDER — PANTOPRAZOLE SODIUM 40 MG PO TBEC
40.0000 mg | DELAYED_RELEASE_TABLET | Freq: Every day | ORAL | Status: DC
  Administered 2017-02-11 – 2017-02-12 (×2): 40 mg via ORAL
  Filled 2017-02-11 (×2): qty 1

## 2017-02-11 MED ORDER — GUAIFENESIN-DM 100-10 MG/5ML PO SYRP: 5.0000 mL | ORAL_SOLUTION | ORAL | 0 refills | Status: DC | PRN

## 2017-02-11 MED ORDER — ALUM & MAG HYDROXIDE-SIMETH 200-200-20 MG/5ML PO SUSP
15.0000 mL | Freq: Four times a day (QID) | ORAL | Status: DC | PRN
  Administered 2017-02-11: 15 mL via ORAL
  Filled 2017-02-11: qty 30

## 2017-02-11 MED ORDER — GABAPENTIN 100 MG PO CAPS: 100.0000 mg | ORAL_CAPSULE | Freq: Three times a day (TID) | ORAL | 0 refills | Status: DC

## 2017-02-11 MED ORDER — DULOXETINE HCL 40 MG PO CPEP: 40.0000 mg | ORAL_CAPSULE | Freq: Every day | ORAL | 0 refills | Status: DC

## 2017-02-11 MED ORDER — ONDANSETRON HCL 4 MG PO TABS: 8.0000 mg | ORAL_TABLET | Freq: Three times a day (TID) | ORAL | Status: DC | PRN

## 2017-02-11 MED ORDER — ZOLPIDEM TARTRATE 5 MG PO TABS: 5.0000 mg | ORAL_TABLET | Freq: Once | ORAL | Status: DC

## 2017-02-11 MED ORDER — PHENYTOIN SODIUM EXTENDED 100 MG PO CAPS
200.0000 mg | ORAL_CAPSULE | Freq: Two times a day (BID) | ORAL | Status: DC
  Administered 2017-02-11 – 2017-02-12 (×2): 200 mg via ORAL
  Filled 2017-02-11 (×2): qty 2

## 2017-02-11 MED ORDER — PHENYTOIN SODIUM EXTENDED 100 MG PO CAPS: 200.0000 mg | ORAL_CAPSULE | Freq: Two times a day (BID) | ORAL | 0 refills | Status: DC

## 2017-02-11 MED ORDER — QUETIAPINE FUMARATE 100 MG PO TABS: 100.0000 mg | ORAL_TABLET | Freq: Every day | ORAL | 0 refills | Status: DC

## 2017-02-11 NOTE — Discharge Summary (Signed)
Physician Discharge Summary  Katherine Morse VHQ:[id number] DOB: [birth year] DOA: 02/09/2017  PCP: No PCP Per Patient  Admit date: 02/09/2017 Discharge date:  Pending bed available at [facility]  Admitted From:  [facility] Disposition:  [facility]  Recommendations for Outpatient Follow-up:  1. Please check phenytoin level on Friday morning to make sure phenytoin level is therapeutic 2. Advised patient to call to find PCP accepting Medicaid patients 3. Follow up pending blood cultures 4. Repeat TSH, free T4 in 4 weeks 5. Repeat phenytoin level on Friday morning 6. CBC in 1 month and further anemia testing if persistent 7. Restart metoprolol if blood pressure remains elevated  Discharge Condition:  Stable, improved CODE STATUS:  Full   Diet recommendation:  Healthy heart  Brief/Interim Summary:  Katherine Morse a 46 y.o.femalewith medical history significant of Bipolar, h/o depression, anxiety, schizophrenia.  She initially presented with 2 weeks of depression with associated suicidal ideation and was brought to the ER by [employer]. Patient [facility] was [facility] but developed fever, cough, myalgias, and eventually became hypotensive.  She was transferred to the ER where she was hypotensive to the 80s systolic and febrile.  Flu PCR was positive. CXR negative.  She was given IVF and tamiflu and admitted to the hospital.  She continued to feel poorly the following day with chills, myalgias, severe fatigue, lightheadedness and cough. She is currently feeling much better, eating better, and able to walk the halls without supplemental oxygen maintaining oxygen saturations in the mid-90s.    Discharge Diagnoses:  Active Problems:   Schizoaffective disorder, depressive type (HCC)   Convulsions/seizures (HCC)   Essential hypertension   Sepsis (HCC)   Influenza A  Sepsis secondary to influenza, BP, tachycardia, and fevers gradually  improved.  She also had associated nausea with poor oral intake.   -  Continue tamiflu for a total of 5 days, currently day 3, last day on 2/15, then stop -  Blood cultures NGTD -  Continue zofran prn   Essential hypertension, held metoprolol due to hypotension -  Consider resuming metoprolol if her blood pressure remains consistently elevated  Suicidal ideation  -  S[place] -  [facility] to [facility] bed available  Schizoaffective disorder -  Appreciate psychiatry assistance -  Discontinued amitriptyline -  Decreased dose of duloxetine, seroquel  Seizure disorder, seen in ER in late January for seizure -  increased phenytoin to 200mg  BID -  Phenytoin level undetectable while at 100mg  BID dose -  Repeat phenytoin level on Friday morning  Sick euthyroid, fT4 wnl -  Repeat TFTs in 4 weeks  Normocytic anemia, no signs of hemorrhage, likely due to viral illness -  Repeat hemoglobin as outpatient  Discharge Instructions  Discharge Instructions    Call MD for:  difficulty breathing, headache or visual disturbances    Complete by:  As directed    Call MD for:  extreme fatigue    Complete by:  As directed    Call MD for:  hives    Complete by:  As directed    Call MD for:  persistant dizziness or light-headedness    Complete by:  As directed    Call MD for:  persistant nausea and vomiting    Complete by:  As directed    Call MD for:  severe uncontrolled pain    Complete by:  As directed    Call MD for:  temperature >100.4    Complete by:  As directed    Diet general    Complete by:  As directed    Increase activity slowly    Complete by:  As directed        Medication List    STOP taking these medications   ARIPiprazole 5 MG tablet Commonly known as:  ABILIFY   LINZESS 145 MCG Caps capsule Generic drug:  linaclotide   metoprolol tartrate 25 MG tablet Commonly known as:  LOPRESSOR     TAKE these medications   DULoxetine HCl 40 MG Cpep Take  40 mg by mouth daily. Start taking on:  02/12/2017 What changed:  See the new instructions.   gabapentin 100 MG capsule Commonly known as:  NEURONTIN Take 1 capsule (100 mg total) by mouth 3 (three) times daily. What changed:  medication strength  how much to take   guaiFENesin-dextromethorphan 100-10 MG/5ML syrup Commonly known as:  ROBITUSSIN DM Take 5 mLs by mouth every 4 (four) hours as needed for cough.   ibuprofen 200 MG tablet Commonly known as:  ADVIL,MOTRIN Take 1 tablet (200 mg total) by mouth every 4 (four) hours as needed for fever, headache, mild pain, moderate pain or cramping.   omeprazole 20 MG capsule Commonly known as:  PRILOSEC Take 20 mg by mouth daily.   ondansetron 4 MG tablet Commonly known as:  ZOFRAN Take 1 tablet (4 mg total) by mouth every 8 (eight) hours as needed for nausea or vomiting.   oseltamivir 75 MG capsule Commonly known as:  TAMIFLU Take 1 capsule (75 mg total) by mouth 2 (two) times daily.   phenytoin 100 MG ER capsule Commonly known as:  DILANTIN Take 2 capsules (200 mg total) by mouth 2 (two) times daily. What changed:  how much to take   QUEtiapine 100 MG tablet Commonly known as:  SEROQUEL Take 1 tablet (100 mg total) by mouth at bedtime. What changed:  See the new instructions.       Allergies  Allergen Reactions  . Morphine   . Other   . Compazine [Prochlorperazine Edisylate] Anxiety  . Morphine And Related Anxiety  . Penicillins Anxiety and Other (See Comments)    Has patient had a PCN reaction causing immediate rash, facial/tongue/throat swelling, SOB or lightheadedness with hypotension: No Has patient had a PCN reaction causing severe rash involving mucus membranes or skin necrosis: No  Has patient had a PCN reaction that required hospitalization: No  Has patient had a PCN reaction occurring within the last 10 years: No  If all of the above answers are "NO", then may proceed with Cephalosporin use.   Dennie Maizes. Vistaril  [Hydroxyzine Hcl] Anxiety    Consultations: Psychiatry   Procedures/Studies: Dg Chest 2 View  Result Date: 02/08/2017 CLINICAL DATA:  Acute onset of hypotension. Positive for influenza. Initial encounter. EXAM: CHEST  2 VIEW COMPARISON:  Chest radiograph performed 09/01/2016 FINDINGS: The lungs are well-aerated. Pulmonary vascularity is at the upper limits of normal. There is no evidence of focal opacification, pleural effusion or pneumothorax. The heart is borderline normal in size. There is apparent tortuosity of the thoracic aorta. No acute osseous abnormalities are seen. Mild chronic compression deformities are again noted along the lower thoracic spine. IMPRESSION: No acute cardiopulmonary process seen. Electronically Signed   By: Roanna RaiderJeffery  Chang M.D.   On: 02/08/2017 18:59       Subjective: Feeling much better today.  Was able to take a shower last night and walk the halls some.  Coughed up copious  phlegm this morning and since then she has felt much better.  Cough decreasing, less SOB, and energy improved.    Discharge Exam: Vitals:   02/10/17 2300 02/11/17 0625  BP: 110/70 121/76  Pulse:  82  Resp:  20  Temp:  98.6 F (37 C)   Vitals:   02/10/17 2014 02/10/17 2300 02/11/17 0625 02/11/17 1115  BP: (!) 151/108 110/70 121/76   Pulse: 92  82   Resp: 20  20   Temp: 99.2 F (37.3 C)  98.6 F (37 C)   TempSrc: Oral  Oral   SpO2: 98%  98% 95%  Weight:      Height:       General exam:  Adult female.  Lying in bed, no acute distress HEENT:  NCAT, MMM Respiratory system:  Minimal residual rales bilaterally Cardiovascular system:  RRR, normal S1/S2. No murmurs, rubs, gallops or clicks.  Warm extremities Gastrointestinal system: Normal active bowel sounds, soft, nondistended, nontender. MSK:  Normal tone and bulk, no lower extremity edema Neuro:  Grossly moves all extremities    The results of significant diagnostics from this hospitalization (including imaging,  microbiology, ancillary and laboratory) are listed below for reference.     Microbiology: Recent Results (from the past 240 hour(s))  Culture, blood (x 2)     Status: None (Preliminary result)   Collection Time: 02/09/17  5:33 AM  Result Value Ref Range Status   Specimen Description BLOOD RIGHT ANTECUBITAL  Final   Special Requests BOTTLES DRAWN AEROBIC ONLY 4CC  Final   Culture   Final    NO GROWTH 1 DAY Performed at [facility], [address] [place], [place] [address]    Report Status PENDING  Incomplete  Culture, blood (x 2)     Status: None (Preliminary result)   Collection Time: 02/09/17  5:33 AM  Result Value Ref Range Status   Specimen Description BLOOD RIGHT HAND  Final   Special Requests IN PEDIATRIC BOTTLE 2CC  Final   Culture   Final    NO GROWTH 1 DAY Performed at [facility], [address] [place], [place] [address]    Report Status PENDING  Incomplete     Labs: BNP (last 3 results) No results for input(s): BNP in the last 8760 hours. Basic Metabolic Panel:  Recent Labs Lab 02/05/17 2305 02/08/17 1912 02/09/17 0531 02/10/17 0455  NA 141 134* 135 138  K 3.7 3.6 3.6 3.5  CL 110 103 106 107  CO2 24 23 22 24   GLUCOSE 98 91 89 77  BUN 17 18 11 6   CREATININE 0.70 0.68 0.64 0.65  CALCIUM 9.3 9.2 8.3* 8.6*  MG  --   --  1.8  --   PHOS  --   --  3.3  --    Liver Function Tests:  Recent Labs Lab 02/05/17 2305 02/08/17 1912 02/09/17 0531  AST 22 20 20   ALT 15 15 14   ALKPHOS 86 84 67  BILITOT 0.3 0.7 0.4  PROT 8.3* 8.2* 6.7  ALBUMIN 4.4 4.2 3.4*   No results for input(s): LIPASE, AMYLASE in the last 168 hours. No results for input(s): AMMONIA in the last 168 hours. CBC:  Recent Labs Lab 02/05/17 2305 02/08/17 1912 02/09/17 0531 02/10/17 0455  WBC 9.3 8.2 5.1 4.3  NEUTROABS  --  7.4  --   --   HGB 12.1 12.5 10.3* 10.7*  HCT 37.4 37.8 31.9* 33.2*  MCV 88.0 85.5  87.2 87.1  PLT 269 221 192 175   Cardiac Enzymes: No  results for input(s): CKTOTAL, CKMB, CKMBINDEX, TROPONINI in the last 168 hours. BNP: Invalid input(s): POCBNP CBG: No results for input(s): GLUCAP in the last 168 hours. D-Dimer No results for input(s): DDIMER in the last 72 hours. Hgb A1c No results for input(s): HGBA1C in the last 72 hours. Lipid Profile No results for input(s): CHOL, HDL, LDLCALC, TRIG, CHOLHDL, LDLDIRECT in the last 72 hours. Thyroid function studies  Recent Labs  02/09/17 0531  TSH 0.223*   Anemia work up No results for input(s): VITAMINB12, FOLATE, FERRITIN, TIBC, IRON, RETICCTPCT in the last 72 hours. Urinalysis    Component Value Date/Time   COLORURINE YELLOW 02/09/2017 0139   APPEARANCEUR CLEAR 02/09/2017 0139   LABSPEC 1.019 02/09/2017 0139   PHURINE 5.0 02/09/2017 0139   GLUCOSEU NEGATIVE 02/09/2017 0139   HGBUR NEGATIVE 02/09/2017 0139   BILIRUBINUR NEGATIVE 02/09/2017 0139   KETONESUR 20 (A) 02/09/2017 0139   PROTEINUR NEGATIVE 02/09/2017 0139   NITRITE NEGATIVE 02/09/2017 0139   LEUKOCYTESUR NEGATIVE 02/09/2017 0139   Sepsis Labs Invalid input(s): PROCALCITONIN,  WBC,  LACTICIDVEN   Time coordinating discharge: Over 30 minutes  SIGNED:   Renae Fickle, MD  [facility] 02/11/2017, 1:15 PM Pager   If 7PM-7AM, please contact night-coverage [email or web address] Password [email or web address]

## 2017-02-11 NOTE — Progress Notes (Signed)
[facility]. LCSWA contacted Admission coordinator Lillia AbedLindsay.  Will inform medical staff when bed is available.

## 2017-02-12 ENCOUNTER — Encounter (HOSPITAL_COMMUNITY): Payer: Self-pay | Admitting: Psychiatry

## 2017-02-12 ENCOUNTER — Other Ambulatory Visit: Payer: Self-pay

## 2017-02-12 ENCOUNTER — Inpatient Hospital Stay (HOSPITAL_COMMUNITY)
Admission: AD | Admit: 2017-02-12 | Discharge: 2017-02-18 | DRG: 885 | Disposition: A | Discharge status: Home / Self Care | Payer: Medicaid Other | Source: Intra-hospital | Attending: Psychiatry | Admitting: Psychiatry

## 2017-02-12 DIAGNOSIS — F251 Schizoaffective disorder, depressive type: Principal | ICD-10-CM | POA: Diagnosis present

## 2017-02-12 DIAGNOSIS — Z888 Allergy status to other drugs, medicaments and biological substances status: Secondary | ICD-10-CM | POA: Diagnosis not present

## 2017-02-12 DIAGNOSIS — E0781 Sick-euthyroid syndrome: Secondary | ICD-10-CM | POA: Diagnosis present

## 2017-02-12 DIAGNOSIS — Z87891 Personal history of nicotine dependence: Secondary | ICD-10-CM

## 2017-02-12 DIAGNOSIS — A419 Sepsis, unspecified organism: Secondary | ICD-10-CM | POA: Diagnosis not present

## 2017-02-12 DIAGNOSIS — R45851 Suicidal ideations: Secondary | ICD-10-CM | POA: Diagnosis present

## 2017-02-12 DIAGNOSIS — R6510 Systemic inflammatory response syndrome (SIRS) of non-infectious origin without acute organ dysfunction: Secondary | ICD-10-CM

## 2017-02-12 DIAGNOSIS — I10 Essential (primary) hypertension: Secondary | ICD-10-CM | POA: Diagnosis not present

## 2017-02-12 DIAGNOSIS — Z811 Family history of alcohol abuse and dependence: Secondary | ICD-10-CM | POA: Diagnosis not present

## 2017-02-12 DIAGNOSIS — Z885 Allergy status to narcotic agent status: Secondary | ICD-10-CM | POA: Diagnosis not present

## 2017-02-12 DIAGNOSIS — J101 Influenza due to other identified influenza virus with other respiratory manifestations: Secondary | ICD-10-CM

## 2017-02-12 DIAGNOSIS — I959 Hypotension, unspecified: Secondary | ICD-10-CM | POA: Diagnosis not present

## 2017-02-12 DIAGNOSIS — Z915 Personal history of self-harm: Secondary | ICD-10-CM

## 2017-02-12 DIAGNOSIS — R451 Restlessness and agitation: Secondary | ICD-10-CM | POA: Diagnosis present

## 2017-02-12 DIAGNOSIS — Z818 Family history of other mental and behavioral disorders: Secondary | ICD-10-CM | POA: Diagnosis not present

## 2017-02-12 DIAGNOSIS — D649 Anemia, unspecified: Secondary | ICD-10-CM | POA: Diagnosis present

## 2017-02-12 DIAGNOSIS — Z79899 Other long term (current) drug therapy: Secondary | ICD-10-CM

## 2017-02-12 DIAGNOSIS — R569 Unspecified convulsions: Secondary | ICD-10-CM

## 2017-02-12 DIAGNOSIS — Z8249 Family history of ischemic heart disease and other diseases of the circulatory system: Secondary | ICD-10-CM

## 2017-02-12 DIAGNOSIS — G4700 Insomnia, unspecified: Secondary | ICD-10-CM | POA: Diagnosis present

## 2017-02-12 DIAGNOSIS — F4310 Post-traumatic stress disorder, unspecified: Secondary | ICD-10-CM | POA: Diagnosis present

## 2017-02-12 DIAGNOSIS — K219 Gastro-esophageal reflux disease without esophagitis: Secondary | ICD-10-CM | POA: Diagnosis present

## 2017-02-12 DIAGNOSIS — Z62810 Personal history of physical and sexual abuse in childhood: Secondary | ICD-10-CM | POA: Diagnosis present

## 2017-02-12 DIAGNOSIS — F419 Anxiety disorder, unspecified: Secondary | ICD-10-CM | POA: Diagnosis present

## 2017-02-12 DIAGNOSIS — Z880 Allergy status to penicillin: Secondary | ICD-10-CM | POA: Diagnosis not present

## 2017-02-12 DIAGNOSIS — F29 Unspecified psychosis not due to a substance or known physiological condition: Secondary | ICD-10-CM | POA: Diagnosis present

## 2017-02-12 DIAGNOSIS — G40909 Epilepsy, unspecified, not intractable, without status epilepticus: Secondary | ICD-10-CM | POA: Diagnosis present

## 2017-02-12 MED ORDER — QUETIAPINE FUMARATE 100 MG PO TABS
100.0000 mg | ORAL_TABLET | Freq: Every day | ORAL | Status: DC
  Filled 2017-02-12 (×2): qty 1

## 2017-02-12 MED ORDER — PANTOPRAZOLE SODIUM 40 MG PO TBEC
40.0000 mg | DELAYED_RELEASE_TABLET | Freq: Every day | ORAL | Status: DC
  Administered 2017-02-13 – 2017-02-18 (×6): 40 mg via ORAL
  Filled 2017-02-12 (×9): qty 1

## 2017-02-12 MED ORDER — DULOXETINE HCL 20 MG PO CPEP
40.0000 mg | ORAL_CAPSULE | Freq: Every day | ORAL | Status: DC
  Administered 2017-02-13 – 2017-02-18 (×6): 40 mg via ORAL
  Filled 2017-02-12 (×9): qty 2

## 2017-02-12 MED ORDER — LORAZEPAM 1 MG PO TABS
ORAL_TABLET | ORAL | Status: AC
Start: 2017-02-12 — End: 2017-02-12
  Filled 2017-02-12: qty 1

## 2017-02-12 MED ORDER — RISAQUAD PO CAPS
2.0000 | ORAL_CAPSULE | Freq: Every day | ORAL | Status: DC
  Filled 2017-02-12 (×2): qty 2

## 2017-02-12 MED ORDER — OSELTAMIVIR PHOSPHATE 75 MG PO CAPS
75.0000 mg | ORAL_CAPSULE | Freq: Two times a day (BID) | ORAL | Status: DC
  Filled 2017-02-12 (×4): qty 1

## 2017-02-12 MED ORDER — IBUPROFEN 200 MG PO TABS
200.0000 mg | ORAL_TABLET | ORAL | Status: DC | PRN
  Administered 2017-02-12 – 2017-02-17 (×6): 200 mg via ORAL
  Filled 2017-02-12 (×7): qty 1

## 2017-02-12 MED ORDER — ALUM & MAG HYDROXIDE-SIMETH 200-200-20 MG/5ML PO SUSP
30.0000 mL | ORAL | Status: DC | PRN
  Administered 2017-02-12: 30 mL via ORAL
  Filled 2017-02-12: qty 30

## 2017-02-12 MED ORDER — NICOTINE 21 MG/24HR TD PT24
21.0000 mg | MEDICATED_PATCH | Freq: Every day | TRANSDERMAL | Status: DC
  Filled 2017-02-12 (×2): qty 1

## 2017-02-12 MED ORDER — ONDANSETRON HCL 4 MG PO TABS: 4.0000 mg | ORAL_TABLET | Freq: Three times a day (TID) | ORAL | Status: DC | PRN

## 2017-02-12 MED ORDER — LORAZEPAM 1 MG PO TABS
1.0000 mg | ORAL_TABLET | Freq: Once | ORAL | Status: AC
  Administered 2017-02-12: 1 mg via ORAL

## 2017-02-12 MED ORDER — LORAZEPAM 2 MG/ML IJ SOLN: 1.0000 mg | Freq: Four times a day (QID) | INTRAMUSCULAR | Status: DC | PRN

## 2017-02-12 MED ORDER — ACETAMINOPHEN 325 MG PO TABS: 650.0000 mg | ORAL_TABLET | Freq: Four times a day (QID) | ORAL | Status: DC | PRN

## 2017-02-12 MED ORDER — MAGNESIUM HYDROXIDE 400 MG/5ML PO SUSP: 30.0000 mL | Freq: Every day | ORAL | Status: DC | PRN

## 2017-02-12 MED ORDER — GABAPENTIN 100 MG PO CAPS
100.0000 mg | ORAL_CAPSULE | Freq: Three times a day (TID) | ORAL | Status: DC
  Administered 2017-02-12 – 2017-02-18 (×19): 100 mg via ORAL
  Filled 2017-02-12 (×25): qty 1

## 2017-02-12 MED ORDER — GUAIFENESIN-DM 100-10 MG/5ML PO SYRP: 5.0000 mL | ORAL_SOLUTION | ORAL | Status: DC | PRN

## 2017-02-12 MED ORDER — QUETIAPINE FUMARATE 50 MG PO TABS
150.0000 mg | ORAL_TABLET | Freq: Every day | ORAL | Status: DC
  Administered 2017-02-12: 150 mg via ORAL
  Filled 2017-02-12 (×2): qty 3

## 2017-02-12 MED ORDER — LORAZEPAM 1 MG PO TABS
1.0000 mg | ORAL_TABLET | Freq: Four times a day (QID) | ORAL | Status: DC | PRN
  Administered 2017-02-12 – 2017-02-16 (×7): 1 mg via ORAL
  Filled 2017-02-12 (×7): qty 1

## 2017-02-12 MED ORDER — SACCHAROMYCES BOULARDII 250 MG PO CAPS
250.0000 mg | ORAL_CAPSULE | Freq: Two times a day (BID) | ORAL | Status: DC
  Administered 2017-02-12 – 2017-02-18 (×12): 250 mg via ORAL
  Filled 2017-02-12 (×16): qty 1

## 2017-02-12 MED ORDER — OSELTAMIVIR PHOSPHATE 75 MG PO CAPS
75.0000 mg | ORAL_CAPSULE | Freq: Two times a day (BID) | ORAL | Status: AC
  Administered 2017-02-12 – 2017-02-14 (×4): 75 mg via ORAL
  Filled 2017-02-12 (×4): qty 1

## 2017-02-12 MED ORDER — PHENYTOIN SODIUM EXTENDED 100 MG PO CAPS
200.0000 mg | ORAL_CAPSULE | Freq: Two times a day (BID) | ORAL | Status: DC
  Administered 2017-02-12 – 2017-02-17 (×10): 200 mg via ORAL
  Filled 2017-02-12 (×16): qty 2

## 2017-02-12 NOTE — Progress Notes (Signed)
Report given to Dan at [facility]. 

## 2017-02-12 NOTE — [facility] Suicide Risk Assessment (Signed)
[facility]   Nursing information obtained from:    Demographic factors:    Current Mental Status:    Loss Factors:    Historical Factors:    Risk Reduction Factors:     Total Time spent with patient: 20 minutes Principal Problem: Schizoaffective disorder, depressive type (HCC) Diagnosis:   Patient Active Problem List   Diagnosis Date Noted  . Sepsis (HCC) [A41.9] 02/08/2017  . Influenza A [J10.1] 02/08/2017  . Convulsions/seizures (HCC) [R56.9] 02/07/2017  . Essential hypertension [I10] 02/07/2017  . Schizoaffective disorder, depressive type (HCC) [F25.1] 02/06/2017   Subjective Data: Please see H&P.   Continued Clinical Symptoms:    The "Alcohol Use Disorders Identification Test", Guidelines for Use in Primary Care, Second Edition.  World Science writerHealth Organization [facility](WHO). Score between 0-7:  no or low risk or alcohol related problems. Score between 8-15:  moderate risk of alcohol related problems. Score between 16-19:  high risk of alcohol related problems. Score 20 or above:  warrants further diagnostic evaluation for alcohol dependence and treatment.   CLINICAL FACTORS:   Depression:   Hopelessness Insomnia Previous Psychiatric Diagnoses and Treatments Medical Diagnoses and Treatments/Surgeries   Musculoskeletal: Strength & Muscle Tone: within normal limits Gait & Station: normal Patient leans: N/A  Psychiatric Specialty Exam: Physical Exam  ROS  Blood pressure 121/77, pulse (!) 104.There is no height or weight on file to calculate BMI.                Please see H&P.                                           COGNITIVE FEATURES THAT CONTRIBUTE TO RISK:  Closed-mindedness, Polarized thinking and Thought constriction (tunnel vision)    SUICIDE RISK:   Mild:  Suicidal ideation of limited frequency, intensity, duration, and specificity.  There are no identifiable plans, no associated intent, mild dysphoria and  related symptoms, good self-control (both objective and subjective assessment), few other risk factors, and identifiable protective factors, including available and accessible social support.  PLAN OF CARE: Please see H&P.   I certify that inpatient services furnished can reasonably be expected to improve the patient's condition.   Jashanti Clinkscale, MD 02/12/2017, 2:18 PM

## 2017-02-12 NOTE — Discharge Summary (Signed)
Physician Discharge Summary  Katherine Morse ZOX:[id number] DOB: 1971-07-21 DOA: 02/09/2017  PCP: No PCP Per Patient  Admit date: 02/09/2017 Discharge date:  02/12/2017  ADDENDUM: PATIENT SEEN AND EXAMINED THIS Am. NO CHANGES FROM YESTERDAY and from Dr. JoaLatricia Heftn Morse's Discharge Summary. STABLE [facility].   [facility]  Recommendations for Outpatient Follow-up:  1. Please check phenytoin level on Friday morning to make sure phenytoin level is therapeutic 2. Advised patient to call to find PCP accepting Medicaid patients 3. Follow up pending blood cultures 4. Repeat TSH, free T4 in 4 weeks 5. Repeat phenytoin level on Friday morning 6. CBC in 1 month and further anemia testing if persistent 7. Restart metoprolol if blood pressure remains elevated  Discharge Condition:  Stable, improved CODE STATUS:  Full   Diet recommendation:  Healthy heart  Brief/Interim Summary:  Katherine Brayboyis a 46 y.o.femalewith medical history significant of Bipolar, h/o depression, anxiety, schizophrenia.  She initially presented with 2 weeks of depression with associated suicidal ideation and was brought to the ER by [employer]. Patient initially was [facility] to [facility] but developed fever, cough, myalgias, and eventually became hypotensive.  She was transferred to the ER where she was hypotensive to the 80s systolic and febrile.  Flu PCR was positive. CXR negative.  She was given IVF and tamiflu and admitted to the hospital.  She continued to feel poorly the following day with chills, myalgias, severe fatigue, lightheadedness and cough. She is currently feeling much better, eating better, and able to walk the halls without supplemental oxygen maintaining oxygen saturations in the mid-90s. No changes since yesterday and [facility] and will D/C.  Discharge Diagnoses:  Active Problems:   Schizoaffective  disorder, depressive type (HCC)   Convulsions/seizures (HCC)   Essential hypertension   Sepsis (HCC)   Influenza A  SIRS secondary to Influenza -BP, tachycardia, and fevers gradually improved.  She also had associated nausea with poor oral intake.  -  Continue tamiflu for a total of 5 days, currently day 3, last day on 2/15, then stop -  Blood cultures NGTD -  Continue zofran prn   Essential hypertension, held metoprolol due to hypotension -  Consider resuming metoprolol if her blood pressure remains consistently elevated  Suicidal ideation  -  Sitter -  [facility]  Schizoaffective disorder -  Appreciate psychiatry assistance -  Discontinued amitriptyline -  Decreased dose of duloxetine, seroquel  Seizure disorder, seen in ER in late January for seizure -  increased phenytoin to 200mg  BID -  Phenytoin level undetectable while at 100mg  BID dose -  Repeat phenytoin level on Friday morning  Sick euthyroid, fT4 wnl -  Repeat TFTs in 4 weeks  Normocytic anemia, no signs of hemorrhage, likely due to viral illness -  Repeat hemoglobin as outpatient  Discharge Instructions  Discharge Instructions    Call MD for:  difficulty breathing, headache or visual disturbances    Complete by:  As directed    Call MD for:  difficulty breathing, headache or visual disturbances    Complete by:  As directed    Call MD for:  extreme fatigue    Complete by:  As directed    Call MD for:  hives    Complete by:  As directed    Call MD for:  persistant dizziness or light-headedness    Complete by:  As directed    Call MD for:  persistant dizziness or light-headedness    Complete by:  As directed    Call MD for:  persistant nausea and vomiting    Complete by:  As directed    Call MD for:  persistant nausea and vomiting    Complete by:  As directed    Call MD for:  severe uncontrolled pain    Complete by:  As directed    Call MD for:  severe  uncontrolled pain    Complete by:  As directed    Call MD for:  temperature >100.4    Complete by:  As directed    Call MD for:  temperature >100.4    Complete by:  As directed    Diet - low sodium heart healthy    Complete by:  As directed    Diet general    Complete by:  As directed    Discharge instructions    Complete by:  As directed    Follow up Care at [facility].   Increase activity slowly    Complete by:  As directed    Increase activity slowly    Complete by:  As directed        Medication List    STOP taking these medications   ARIPiprazole 5 MG tablet Commonly known as:  ABILIFY   LINZESS 145 MCG Caps capsule Generic drug:  linaclotide   metoprolol tartrate 25 MG tablet Commonly known as:  LOPRESSOR     TAKE these medications   DULoxetine HCl 40 MG Cpep Take 40 mg by mouth daily. What changed:  See the new instructions.   gabapentin 100 MG capsule Commonly known as:  NEURONTIN Take 1 capsule (100 mg total) by mouth 3 (three) times daily. What changed:  medication strength  how much to take   guaiFENesin-dextromethorphan 100-10 MG/5ML syrup Commonly known as:  ROBITUSSIN DM Take 5 mLs by mouth every 4 (four) hours as needed for cough.   ibuprofen 200 MG tablet Commonly known as:  ADVIL,MOTRIN Take 1 tablet (200 mg total) by mouth every 4 (four) hours as needed for fever, headache, mild pain, moderate pain or cramping.   omeprazole 20 MG capsule Commonly known as:  PRILOSEC Take 20 mg by mouth daily.   ondansetron 4 MG tablet Commonly known as:  ZOFRAN Take 1 tablet (4 mg total) by mouth every 8 (eight) hours as needed for nausea or vomiting.   oseltamivir 75 MG capsule Commonly known as:  TAMIFLU Take 1 capsule (75 mg total) by mouth 2 (two) times daily.   phenytoin 100 MG ER capsule Commonly known as:  DILANTIN Take 2 capsules (200 mg total) by mouth 2 (two) times daily. What changed:  how much to take   QUEtiapine 100 MG  tablet Commonly known as:  SEROQUEL Take 1 tablet (100 mg total) by mouth at bedtime. What changed:  See the new instructions.       Allergies  Allergen Reactions  . Morphine   . Other   . Compazine [Prochlorperazine Edisylate] Anxiety  . Morphine And Related Anxiety  . Penicillins Anxiety and Other (See Comments)    Has patient had a PCN reaction causing immediate rash, facial/tongue/throat swelling, SOB or lightheadedness with hypotension: No Has patient had a PCN reaction causing severe rash involving mucus membranes or skin necrosis: No  Has patient had a PCN reaction that required hospitalization: No  Has patient had a PCN reaction occurring  within the last 10 years: No  If all of the above answers are "NO", then may proceed with Cephalosporin use.   Dennie Maizes [Hydroxyzine Hcl] Anxiety    Consultations: Inpatient Psychiatry  Procedures/Studies: Dg Chest 2 View  Result Date: 02/08/2017 CLINICAL DATA:  Acute onset of hypotension. Positive for influenza. Initial encounter. EXAM: CHEST  2 VIEW COMPARISON:  Chest radiograph performed 09/01/2016 FINDINGS: The lungs are well-aerated. Pulmonary vascularity is at the upper limits of normal. There is no evidence of focal opacification, pleural effusion or pneumothorax. The heart is borderline normal in size. There is apparent tortuosity of the thoracic aorta. No acute osseous abnormalities are seen. Mild chronic compression deformities are again noted along the lower thoracic spine. IMPRESSION: No acute cardiopulmonary process seen. Electronically Signed   By: Roanna Raider M.D.   On: 02/08/2017 18:59   Subjective: Still coughing but not as bad. No SOB. Improved breathing. Had some gas.   Discharge Exam: Vitals:   02/11/17 2028 02/12/17 0528  BP: 100/65 112/67  Pulse: 90 80  Resp: 18 16  Temp: 98.6 F (37 C) 98.8 F (37.1 C)   Vitals:   02/11/17 1115 02/11/17 1520 02/11/17 2028 02/12/17 0528  BP:   100/65 112/67  Pulse:   95 90 80  Resp:   18 16  Temp:  99.1 F (37.3 C) 98.6 F (37 C) 98.8 F (37.1 C)  TempSrc:   Oral Oral  SpO2: 95% 97% 97% 96%  Weight:      Height:       General exam:  Adult female.  Lying in bed, no acute distress HEENT:  NCAT, MMM Respiratory system:  Minimal residual rales bilaterally Cardiovascular system:  RRR, normal S1/S2. No murmurs, rubs, gallops or clicks.  Warm extremities Gastrointestinal system: Normal active bowel sounds, soft, nondistended, nontender. MSK:  Normal tone and bulk, no lower extremity edema Neuro:  Grossly moves all extremities  The results of significant diagnostics from this hospitalization (including imaging, microbiology, ancillary and laboratory) are listed below for reference.     Microbiology: Recent Results (from the past 240 hour(s))  Culture, blood (x 2)     Status: None (Preliminary result)   Collection Time: 02/09/17  5:33 AM  Result Value Ref Range Status   Specimen Description BLOOD RIGHT ANTECUBITAL  Final   Special Requests BOTTLES DRAWN AEROBIC ONLY 4CC  Final   Culture   Final    NO GROWTH 2 DAYS Performed at [facility], [address], [place], [place] [address]    Report Status PENDING  Incomplete  Culture, blood (x 2)     Status: None (Preliminary result)   Collection Time: 02/09/17  5:33 AM  Result Value Ref Range Status   Specimen Description BLOOD RIGHT HAND  Final   Special Requests IN PEDIATRIC BOTTLE 2CC  Final   Culture   Final    NO GROWTH 2 DAYS Performed at [facility], [address] [place], [place] [address]    Report Status PENDING  Incomplete     Labs: BNP (last 3 results) No results for input(s): BNP in the last 8760 hours. Basic Metabolic Panel:  Recent Labs Lab 02/05/17 2305 02/08/17 1912 02/09/17 0531 02/10/17 0455  NA 141 134* 135 138  K 3.7 3.6 3.6 3.5  CL 110 103 106 107  CO2 24 23 22 24   GLUCOSE 98 91 89 77  BUN 17 18 11 6   CREATININE 0.70 0.68 0.64 0.65  CALCIUM  9.3 9.2 8.3* 8.6*  MG  --   --  1.8  --   PHOS  --   --  3.3  --    Liver Function Tests:  Recent Labs Lab 02/05/17 2305 02/08/17 1912 02/09/17 0531  AST 22 20 20   ALT 15 15 14   ALKPHOS 86 84 67  BILITOT 0.3 0.7 0.4  PROT 8.3* 8.2* 6.7  ALBUMIN 4.4 4.2 3.4*   No results for input(s): LIPASE, AMYLASE in the last 168 hours. No results for input(s): AMMONIA in the last 168 hours. CBC:  Recent Labs Lab 02/05/17 2305 02/08/17 1912 02/09/17 0531 02/10/17 0455  WBC 9.3 8.2 5.1 4.3  NEUTROABS  --  7.4  --   --   HGB 12.1 12.5 10.3* 10.7*  HCT 37.4 37.8 31.9* 33.2*  MCV 88.0 85.5 87.2 87.1  PLT 269 221 192 175   Cardiac Enzymes: No results for input(s): CKTOTAL, CKMB, CKMBINDEX, TROPONINI in the last 168 hours. BNP: Invalid input(s): POCBNP CBG: No results for input(s): GLUCAP in the last 168 hours. D-Dimer No results for input(s): DDIMER in the last 72 hours. Hgb A1c No results for input(s): HGBA1C in the last 72 hours. Lipid Profile No results for input(s): CHOL, HDL, LDLCALC, TRIG, CHOLHDL, LDLDIRECT in the last 72 hours. Thyroid function studies No results for input(s): TSH, T4TOTAL, T3FREE, THYROIDAB in the last 72 hours.  Invalid input(s): FREET3 Anemia work up No results for input(s): VITAMINB12, FOLATE, FERRITIN, TIBC, IRON, RETICCTPCT in the last 72 hours. Urinalysis    Component Value Date/Time   COLORURINE YELLOW 02/09/2017 0139   APPEARANCEUR CLEAR 02/09/2017 0139   LABSPEC 1.019 02/09/2017 0139   PHURINE 5.0 02/09/2017 0139   GLUCOSEU NEGATIVE 02/09/2017 0139   HGBUR NEGATIVE 02/09/2017 0139   BILIRUBINUR NEGATIVE 02/09/2017 0139   KETONESUR 20 (A) 02/09/2017 0139   PROTEINUR NEGATIVE 02/09/2017 0139   NITRITE NEGATIVE 02/09/2017 0139   LEUKOCYTESUR NEGATIVE 02/09/2017 0139   Sepsis Labs Invalid input(s): PROCALCITONIN,  WBC,  LACTICIDVEN   Time coordinating discharge: Over 30 minutes  SIGNED:  Merlene Laughter, MD  [facility] 02/12/2017, 10:47 AM Pager [phone]  If 7PM-7AM, please contact night-coverage [email or web address] Password [email or web address]

## 2017-02-12 NOTE — Tx Team (Signed)
Initial Treatment Plan 02/12/2017 2:35 PM [name] ZOX:[id number]    PATIENT STRESSORS: Financial difficulties Loss of loved ones- anniversary of death of boyfriend and son Marital or family conflict   PATIENT STRENGTHS: Ability for insight Active sense of humor Average or above average intelligence Capable of independent living Wellsite geologistCommunication skills General fund of knowledge Motivation for treatment/growth   PATIENT IDENTIFIED PROBLEMS: Depression  Anxiety  Suicidal ideation  Psychosis  "Be able to not worry about things so much"  "Get my medicines stabilized"    DISCHARGE CRITERIA:  Ability to meet basic life and health needs Adequate post-discharge living arrangements Improved stabilization in mood, thinking, and/or behavior Motivation to continue treatment in a less acute level of care Reduction of life-threatening or endangering symptoms to within safe limits Safe-care adequate arrangements made Verbal commitment to aftercare and medication compliance  PRELIMINARY DISCHARGE PLAN: Outpatient therapy  PATIENT/FAMILY INVOLVEMENT: This treatment plan has been presented to and reviewed with the patient, Katherine Morse.  The patient and family have been given the opportunity to ask questions and make suggestions.  Katherine Royalaniel P Joevanni Roddey, RN 02/12/2017, 2:35 PM

## 2017-02-12 NOTE — [facility] Group Notes (Signed)
[facility] LCSW Group Therapy  02/12/2017 2:42 PM   Type of Therapy:  Group Therapy   Participation Level:  Engaged  Participation Quality:  Attentive  Affect:  Appropriate   Cognitive:  Alert   Insight:  Engaged  Engagement in Therapy:  Improving   Modes of Intervention:  Education, Exploration, Socialization   Summary of Progress/Problems: Jerra was engaged throughout her stay, but was in and out of group a few times during David's presentation.   Onalee HuaDavid from [employer] was here to tell his story of recovery, inform patients about [facility] and play his guitar.   Baldo DaubJolan Cristela Stalder 02/12/2017 2:42 PM
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
## 2017-02-12 NOTE — Progress Notes (Signed)
Patient came to RN c/o pain in mid sternum, tender to touch, "10/10" aching.  Denied SOB.  VS were normal per flowsheet. Patient anxious and upset right now.  MD notified received order for ativan, given ibuprofen and maalox.  After MD spoke with patient patient reported marked improvement in chest pain.  EKG obtained and no abnormalities observed.

## 2017-02-12 NOTE — Care Management Note (Signed)
Case Management Note  Patient Details  Name: Katherine Morse MRN: [id number] Date of Birth: 21-Oct-1971  Subjective/Objective:   46 y.o. [facility] CSW has been following for placement since admission. No further CM needs.                 Action/Plan:CM will sign off for now but will be available should additional discharge needs arise or disposition change.    Expected Discharge Date:  02/12/17               [facility]  In-House Referral:  Clinical Social Work  Discharge planning Services  CM Consult  Post Acute Care Choice:  NA Choice offered to:  NA  DME Arranged:  N/A DME Agency:  NA  HH Arranged:  NA HH Agency:  NA  Status of Service:  Completed, signed off  If discussed at Long Length of Stay Meetings, dates discussed:    Additional Comments:  Katherine Morse, Katherine Hattabaugh M, RN 02/12/2017, 11:38 AM
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
## 2017-02-12 NOTE — Progress Notes (Signed)
  D: Pt approached the writer several times during the first hour of the shift. Informed the writer that her belongings didn't come over and she was trying not to "go off". Writer encouraged pt to remain calm and informed that writer would check on her belongings. Stated that she was concerned because money was in the property. AC was present and informed the pt that the property was at the hosp and wouldn't be released without a signature. When asked about her day pt stated, "they're going to have to look for me a place to stay because I'm not going no where where somebody smoke crack".  Stated she has a "lot on her". Informed the Clinical research associatewriter that she has "2 kids in prison, [place]." Also that her boyfriend died 6 yrs ago in Feb. Pt has no questions or concerns.    A:  Support and encouragement was offered. 15 min checks continued for safety.  R: Pt remains safe.

## 2017-02-12 NOTE — Progress Notes (Addendum)
[facility] AC[facility]. [facility] is aware patient is ready to return to continue treatment, patient placed on list.   Will inform medical staff when bed is available.   Patient has been accepted bed Room 504 Bed 1 [facility] will transport patient at 11:30 am LCSWA [facility]. No other needs identified at this time.

## 2017-02-13 MED ORDER — ALBUTEROL SULFATE (2.5 MG/3ML) 0.083% IN NEBU
2.5000 mg | INHALATION_SOLUTION | RESPIRATORY_TRACT | Status: DC | PRN
  Administered 2017-02-13 – 2017-02-16 (×3): 2.5 mg via RESPIRATORY_TRACT
  Filled 2017-02-13 (×2): qty 3

## 2017-02-13 MED ORDER — ALBUTEROL SULFATE (2.5 MG/3ML) 0.083% IN NEBU
INHALATION_SOLUTION | RESPIRATORY_TRACT | Status: AC
  Administered 2017-02-13: 2.5 mg via RESPIRATORY_TRACT
  Filled 2017-02-13: qty 3

## 2017-02-13 MED ORDER — QUETIAPINE FUMARATE 200 MG PO TABS
200.0000 mg | ORAL_TABLET | Freq: Every day | ORAL | Status: DC
  Administered 2017-02-13: 200 mg via ORAL
  Filled 2017-02-13 (×2): qty 1

## 2017-02-13 MED ORDER — METOPROLOL TARTRATE 25 MG PO TABS
25.0000 mg | ORAL_TABLET | Freq: Every day | ORAL | Status: DC
  Administered 2017-02-13: 25 mg via ORAL
  Filled 2017-02-13 (×6): qty 1

## 2017-02-13 NOTE — Progress Notes (Signed)
Patient ID: Katherine Morse, female   DOB: [birth year], 46 y.o.   MRN: [id number] D: Client visible on the unit momentarily, without her mask. A: Writer explained to client she had to wear mask when out on the milieu for protection of peers, staff, and herself due to recent flu episode as she is on droplet precautions. Client was argumentative and resistant but reluctantly put mask on. Staff will monitor q3815min for safety. R: Client is safe on the unit, did not attend group.
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
## 2017-02-13 NOTE — Progress Notes (Signed)
Recreation Therapy Notes  Date: 02/13/17 Time: 1000 Location: 500 Hall Dayroom  Group Topic: Coping Skills  Goal Area(s) Addresses:  Patient will be able to identify positive coping skills. Patient will be able to identify benefits of using coping skills post d/c.  Behavioral Response: Engaged  Intervention: Magazines, scissors, glue sticks, worksheets, Holiday representativeconstruction paper  Activity:  PharmacologistCoping Skills.  Patients were given a worksheet divided into five sections (diversions, social, cognitive, tension releasers and physical).  Patients were to locate coping skills for each area in the magazines provided.  Patients were to then glue the coping skill to the corresponding areas.  Education:Coping Skills, Discharge Planning.   Education Outcome: Acknowledges understanding/In group clarification offered/Needs additional education.   Clinical Observations/Feedback:  Pt identified her coping skills as: diversions- time, kids; social- putting on make-up with friends; cognitive- shopping; tension releasers- pamper myself and physical- drink a lot of water.  Pt stated using her coping skills will "make sure I take care of myself and be motivated".   Caroll RancherMarjette Chantelle Verdi, LRT/CTRS     Caroll RancherLindsay, Kenlee Maler A 02/13/2017 12:22 PM

## 2017-02-13 NOTE — BHH Group Notes (Signed)
BHH Group Notes:  (Counselor/Nursing/MHT/Case Management/Adjunct)  02/13/2017 1:15PM  Type of Therapy:  Group Therapy  Participation Level:  Active  Participation Quality:  Appropriate  Affect:  Flat  Cognitive:  Oriented  Insight:  Improving  Engagement in Group:  Limited  Engagement in Therapy:  Limited  Modes of Intervention:  Discussion, Exploration and Socialization  Summary of Progress/Problems: The topic for group was balance in life.  Pt participated in the discussion about when their life was in balance and out of balance and how this feels.  Pt discussed ways to get back in balance and short term goals they can work on to get where they want to be. "I feel pretty balanced today.  I feel like I am thinking clearly.  And my mood is good.  That's a lot different than when I was here before."  Cited her grandchildren as a thing that is rewarding for her, and keeps her focused. Taamy talked a bit her anger, and how that is a challenge for her. "When people just keep yammering away deliberately, I am ready to lose it."  Was able to problem solve around this.   Daryel Geraldorth, Jametta Moorehead B 02/13/2017 1:07 PM

## 2017-02-13 NOTE — Tx Team (Signed)
Interdisciplinary Treatment and Diagnostic Plan Update  02/13/2017 Time of Session: 10:20 AM  Katherine Morse MRN: [id number]  Principal Diagnosis: Schizoaffective disorder, depressive type ([place])  Secondary Diagnoses: Principal Problem:   Schizoaffective disorder, depressive type ([place]) Active Problems:   Convulsions/seizures ([place])   Essential hypertension   Influenza A   PTSD (post-traumatic stress disorder)   Current Medications:  Current Facility-Administered Medications  Medication Dose Route Frequency Provider Last Rate Last Dose  . acetaminophen (TYLENOL) tablet 650 mg  650 mg Oral Q6H PRN Encarnacion Slates, NP      . alum & mag hydroxide-simeth (MAALOX/MYLANTA) 200-200-20 MG/5ML suspension 30 mL  30 mL Oral Q4H PRN Encarnacion Slates, NP   30 mL at 02/12/17 1354  . DULoxetine (CYMBALTA) DR capsule 40 mg  40 mg Oral Daily Encarnacion Slates, NP   40 mg at 02/13/17 0758  . gabapentin (NEURONTIN) capsule 100 mg  100 mg Oral TID Encarnacion Slates, NP   100 mg at 02/13/17 0758  . guaiFENesin-dextromethorphan (ROBITUSSIN DM) 100-10 MG/5ML syrup 5 mL  5 mL Oral Q4H PRN Encarnacion Slates, NP      . ibuprofen (ADVIL,MOTRIN) tablet 200 mg  200 mg Oral Q4H PRN Encarnacion Slates, NP   200 mg at 02/13/17 0800  . LORazepam (ATIVAN) tablet 1 mg  1 mg Oral Q6H PRN Ursula Alert, MD   1 mg at 02/12/17 2139   Or  . LORazepam (ATIVAN) injection 1 mg  1 mg Intramuscular Q6H PRN Saramma Eappen, MD      . magnesium hydroxide (MILK OF MAGNESIA) suspension 30 mL  30 mL Oral Daily PRN Encarnacion Slates, NP      . ondansetron (ZOFRAN) tablet 4 mg  4 mg Oral Q8H PRN Encarnacion Slates, NP      . oseltamivir (TAMIFLU) capsule 75 mg  75 mg Oral BID Ursula Alert, MD   75 mg at 02/13/17 0758  . pantoprazole (PROTONIX) EC tablet 40 mg  40 mg Oral Daily Encarnacion Slates, NP   40 mg at 02/13/17 0758  . phenytoin (DILANTIN) ER capsule 200 mg  200 mg Oral BID Encarnacion Slates, NP   200 mg at 02/13/17 0758  . QUEtiapine (SEROQUEL) tablet 200 mg  200 mg  Oral QHS Saramma Eappen, MD      . saccharomyces boulardii (FLORASTOR) capsule 250 mg  250 mg Oral BID Ursula Alert, MD   250 mg at 02/13/17 0758    PTA Medications: Prescriptions Prior to Admission  Medication Sig Dispense Refill Last Dose  . DULoxetine 40 MG CPEP Take 40 mg by mouth daily. 30 capsule 0   . gabapentin (NEURONTIN) 100 MG capsule Take 1 capsule (100 mg total) by mouth 3 (three) times daily. 90 capsule 0   . guaiFENesin-dextromethorphan (ROBITUSSIN DM) 100-10 MG/5ML syrup Take 5 mLs by mouth every 4 (four) hours as needed for cough. 118 mL 0   . ibuprofen (ADVIL,MOTRIN) 200 MG tablet Take 1 tablet (200 mg total) by mouth every 4 (four) hours as needed for fever, headache, mild pain, moderate pain or cramping. 30 tablet 0   . omeprazole (PRILOSEC) 20 MG capsule Take 20 mg by mouth daily.   Past Week at Unknown time  . ondansetron (ZOFRAN) 4 MG tablet Take 1 tablet (4 mg total) by mouth every 8 (eight) hours as needed for nausea or vomiting. 20 tablet 0   . oseltamivir (TAMIFLU) 75 MG capsule Take 1 capsule (75 mg  total) by mouth 2 (two) times daily. 5 capsule 0   . phenytoin (DILANTIN) 100 MG ER capsule Take 2 capsules (200 mg total) by mouth 2 (two) times daily. 60 capsule 0   . QUEtiapine (SEROQUEL) 100 MG tablet Take 1 tablet (100 mg total) by mouth at bedtime. 30 tablet 0     Treatment Modalities: Medication Management, Group therapy, Case management,  1 to 1 session with clinician, Psychoeducation, Recreational therapy.   Physician Treatment Plan for Primary Diagnosis: Schizoaffective disorder, depressive type ([place]) Long Term Goal(s): Improvement in symptoms so as ready for discharge  Short Term Goals: Ability to identify changes in lifestyle to reduce recurrence of condition will improve  Medication Management: Evaluate patient's response, side effects, and tolerance of medication regimen.  Therapeutic Interventions: 1 to 1 sessions, Unit Group sessions and  Medication administration.  Evaluation of Outcomes: Not Met  Physician Treatment Plan for Secondary Diagnosis: Principal Problem:   Schizoaffective disorder, depressive type ([place]) Active Problems:   Convulsions/seizures ([place])   Essential hypertension   Influenza A   PTSD (post-traumatic stress disorder)   Long Term Goal(s): Improvement in symptoms so as ready for discharge  Short Term Goals: Compliance with prescribed medications will improve  Medication Management: Evaluate patient's response, side effects, and tolerance of medication regimen.  Therapeutic Interventions: 1 to 1 sessions, Unit Group sessions and Medication administration.  Evaluation of Outcomes: Not Met   RN Treatment Plan for Primary Diagnosis: Schizoaffective disorder, depressive type ([place]) Long Term Goal(s): Knowledge of disease and therapeutic regimen to maintain health will improve  Short Term Goals: Ability to disclose and discuss suicidal ideas and Compliance with prescribed medications will improve  Medication Management: RN will administer medications as ordered by provider, will assess and evaluate patient's response and provide education to patient for prescribed medication. RN will report any adverse and/or side effects to prescribing provider.  Therapeutic Interventions: 1 on 1 counseling sessions, Psychoeducation, Medication administration, Evaluate responses to treatment, Monitor vital signs and CBGs as ordered, Perform/monitor CIWA, COWS, AIMS and Fall Risk screenings as ordered, Perform wound care treatments as ordered.  Evaluation of Outcomes: Not Met   Recreational Therapy Treatment Plan for Primary Diagnosis: Schizoaffective disorder, depressive type ([place]) Long Term Goal(s): LTG- Patient will participate in recreation therapy tx in at least 2 group sessions without prompting from LRT.  Short Term Goals:  Patient will be able to identify at least 5 coping skills for admitting dx by conclusion  of recreation therapy tx.  Treatment Modalities: Group and Pet Therapy  Therapeutic Interventions: Psychoeducation  Evaluation of Outcomes: Progressing   LCSW Treatment Plan for Primary Diagnosis: Schizoaffective disorder, depressive type ([place]) Long Term Goal(s): Safe transition to appropriate next level of care at discharge, Engage patient in therapeutic group addressing interpersonal concerns.  Short Term Goals: Engage patient in aftercare planning with referrals and resources and Increase skills for wellness and recovery  Therapeutic Interventions: Assess for all discharge needs, 1 to 1 time with Social worker, Explore available resources and support systems, Assess for adequacy in community support network, Educate family and significant other(s) on suicide prevention, Complete Psychosocial Assessment, Interpersonal group therapy.  Evaluation of Outcomes: Not Met   Progress in Treatment: Attending groups: Yes Participating in groups: Yes Taking medication as prescribed: Yes, MD continues to assess for medication changes as needed Toleration medication: Yes, no side effects reported at this time Family/Significant other contact made: No, patient stated there is no one to contact.  Patient understands diagnosis: Limited  insight  Discussing patient identified problems/goals with staff: Yes Medical problems stabilized or resolved: Yes Denies suicidal/homicidal ideation: Yes  Issues/concerns per patient self-inventory: None Other: N/A  New problem(s) identified: None identified at this time.   New Short Term/Long Term Goal(s): None identified at this time.   Discharge Plan or Barriers: Go to [facility], [place] and follow up at [facility].   Reason for Continuation of Hospitalization: Anxiety Depression Medication stabilization Suicidal ideation   Estimated Length of Stay: 3-5 days  Attendees: Patient: 02/13/2017  10:20 AM  Physician: Dr. Shea Evans 02/13/2017  10:20  AM  Nursing: Benjamine Mola.I, RN  02/13/2017  10:20 AM  RN Care Manager: Lars Pinks 02/13/2017  10:20 AM  Social Worker: Ripley Fraise, LCSW 02/13/2017  10:20 AM  Recreational Therapist: Winfield Cunas 02/13/2017  10:20 AM  Other: Radonna Ricker, Social Work Intern  02/13/2017  10:20 AM  Other:  02/13/2017  10:20 AM  Other: 02/13/2017  10:20 AM    Scribe for Treatment Team: Radonna Ricker, Social Work Intern 02/13/2017 10:20 AM

## 2017-02-13 NOTE — Progress Notes (Signed)
Recreation Therapy Notes  INPATIENT RECREATION THERAPY ASSESSMENT  Patient Details Name: Katherine Morse MRN: [id number] DOB: 12/14/1971 Today's Date: 02/13/2017  Patient Stressors: Death, Other (Comment)  Pt stated she was here for getting into an argument with her roommate. Pt stated the death of her son and boyfriend were triggers for her.  Coping Skills:   Isolate, Avoidance, Self-Injury, Exercise, Talking, Music  Personal Challenges: Anger, Communication, Concentration, Decision-Making, Expressing Yourself, Problem-Solving, Relationships, Self-Esteem/Confidence, Social Interaction, Stress Management, Trusting Others  Leisure Interests (2+):  Exercise - Walking, Social - Social Media, Social - Family  Awareness of Community Resources:  No  Patient Strengths:  Feels God will bless me to get self together, get back with family  Patient Identified Areas of Improvement:  My mouth, my trust, holding grudges  Current Recreation Participation:  Everyday  Patient Goal for Hospitalization:  "Keep taking medications, talk to doctor, get on the right track and go to groups"  [place]  [place]  Current SI (including self-harm):  No  Current HI:  No  Consent to Intern Participation: N/A   Caroll RancherMarjette Mckoy Bhakta, LRT/CTRS  Caroll RancherLindsay, Emmalynn Pinkham A 02/13/2017, 12:49 PM

## 2017-02-13 NOTE — Progress Notes (Addendum)
[facility] MD Progress Note  02/13/2017 11:34 AM Katherine Morse  MRN:  [id number] Subjective:  Pt states : I still have this vague chest pain , I did have this kind of chest pain in the past when I was anxious ."   Objective:Patient seen and chart reviewed.Discussed patient with treatment team.  Pt today is seen as anxious , dysphoric - ruminates about the death of her son and her ex boyfriend. Pt also anxious about having some trouble getting her belongings back Staff to assist her with the same. Pt encouraged to make use of PRN medications , continues to encourage and support.    Principal Problem: Schizoaffective disorder, depressive type (HCC) Diagnosis:   Patient Active Problem List   Diagnosis Date Noted  . PTSD (post-traumatic stress disorder) [F43.10] 02/12/2017  . Sepsis (HCC) [A41.9] 02/08/2017  . Influenza A [J10.1] 02/08/2017  . Convulsions/seizures (HCC) [R56.9] 02/07/2017  . Essential hypertension [I10] 02/07/2017  . Schizoaffective disorder, depressive type (HCC) [F25.1] 02/06/2017   Total Time spent with patient: 20 minutes  Past Psychiatric History: Please see H&P.   Past Medical History:  Past Medical History:  Diagnosis Date  . Anxiety   . Bipolar 1 disorder (HCC)   . Depression   . Hypertension   . Schizophrenia (HCC)   . Seizures (HCC)     Past Surgical History:  Procedure Laterality Date  . ABLATION    . CESAREAN SECTION     Family History:  Family History  Problem Relation Age of Onset  . Hypertension Mother   . Seizures Mother   . Mental illness Son   . Seizures Sister   . Alcoholism Cousin    Family Psychiatric  History: Please see H&P.  Social History: Please see H&P.  History  Alcohol Use No     History  Drug Use No    Social History   Social History  . Marital status: Single    Spouse name: N/A  . Number of children: N/A  . Years of education: N/A   Social History Main Topics  . Smoking status: Former Games developer  . Smokeless  tobacco: Never Used  . Alcohol use No  . Drug use: No  . Sexual activity: Not Asked   Other Topics Concern  . None   Social History Narrative  . None   Additional Social History:    Pain Medications: See PTA medication list Prescriptions: See PTA medication list, Dilantin, Cymbalta and one that she has not gotten filled yet. Topolol, Gabapentin, Lynsess, Prilosec Over the Counter: See PTA medication list History of alcohol / drug use?: No history of alcohol / drug abuse                    Sleep: restless  Appetite:  Fair  Current Medications: Current Facility-Administered Medications  Medication Dose Route Frequency Provider Last Rate Last Dose  . acetaminophen (TYLENOL) tablet 650 mg  650 mg Oral Q6H PRN Sanjuana Kava, NP      . alum & mag hydroxide-simeth (MAALOX/MYLANTA) 200-200-20 MG/5ML suspension 30 mL  30 mL Oral Q4H PRN Sanjuana Kava, NP   30 mL at 02/12/17 1354  . DULoxetine (CYMBALTA) DR capsule 40 mg  40 mg Oral Daily Sanjuana Kava, NP   40 mg at 02/13/17 0758  . gabapentin (NEURONTIN) capsule 100 mg  100 mg Oral TID Sanjuana Kava, NP   100 mg at 02/13/17 0758  . guaiFENesin-dextromethorphan (ROBITUSSIN DM) 100-10 MG/5ML syrup  5 mL  5 mL Oral Q4H PRN Sanjuana KavaAgnes I Nwoko, NP      . ibuprofen (ADVIL,MOTRIN) tablet 200 mg  200 mg Oral Q4H PRN Sanjuana KavaAgnes I Nwoko, NP   200 mg at 02/13/17 0800  . LORazepam (ATIVAN) tablet 1 mg  1 mg Oral Q6H PRN Jomarie LongsSaramma Deshannon Seide, MD   1 mg at 02/12/17 2139   Or  . LORazepam (ATIVAN) injection 1 mg  1 mg Intramuscular Q6H PRN Sherise Geerdes, MD      . magnesium hydroxide (MILK OF MAGNESIA) suspension 30 mL  30 mL Oral Daily PRN Sanjuana KavaAgnes I Nwoko, NP      . metoprolol tartrate (LOPRESSOR) tablet 25 mg  25 mg Oral Daily Elchanan Bob, MD      . ondansetron (ZOFRAN) tablet 4 mg  4 mg Oral Q8H PRN Sanjuana KavaAgnes I Nwoko, NP      . oseltamivir (TAMIFLU) capsule 75 mg  75 mg Oral BID Jomarie LongsSaramma Jessenya Berdan, MD   75 mg at 02/13/17 0758  . pantoprazole (PROTONIX) EC tablet  40 mg  40 mg Oral Daily Sanjuana KavaAgnes I Nwoko, NP   40 mg at 02/13/17 0758  . phenytoin (DILANTIN) ER capsule 200 mg  200 mg Oral BID Sanjuana KavaAgnes I Nwoko, NP   200 mg at 02/13/17 0758  . QUEtiapine (SEROQUEL) tablet 200 mg  200 mg Oral QHS Riyah Bardon, MD      . saccharomyces boulardii (FLORASTOR) capsule 250 mg  250 mg Oral BID Jomarie LongsSaramma Kaylany Tesoriero, MD   250 mg at 02/13/17 [id number]    Lab Results: No results found for this or any previous visit (from the past 48 hour(s)).  Blood Alcohol level:  Lab Results  Component Value Date   ETH <5 02/05/2017    Metabolic Disorder Labs: Lab Results  Component Value Date   HGBA1C 4.4 (L) 02/08/2017   MPG 80 02/08/2017   Lab Results  Component Value Date   PROLACTIN 23.6 (H) 02/08/2017   Lab Results  Component Value Date   CHOL 206 (H) 02/08/2017   TRIG 84 02/08/2017   HDL 63 02/08/2017   CHOLHDL 3.3 02/08/2017   VLDL 17 02/08/2017   LDLCALC 126 (H) 02/08/2017    Physical Findings: AIMS: Facial and Oral Movements Muscles of Facial Expression: None, normal Lips and Perioral Area: None, normal Jaw: None, normal Tongue: None, normal,Extremity Movements Upper (arms, wrists, hands, fingers): None, normal Lower (legs, knees, ankles, toes): None, normal, Trunk Movements Neck, shoulders, hips: None, normal, Overall Severity Severity of abnormal movements (highest score from questions above): None, normal Incapacitation due to abnormal movements: None, normal Patient's awareness of abnormal movements (rate only patient's report): No Awareness, Dental Status Current problems with teeth and/or dentures?: No Does patient usually wear dentures?: No  CIWA:    COWS:     Musculoskeletal: Strength & Muscle Tone: within normal limits Gait & Station: normal Patient leans: N/A  Psychiatric Specialty Exam: Physical Exam  Nursing note and vitals reviewed.   Review of Systems  Psychiatric/Behavioral: Positive for depression. The patient is nervous/anxious.    All other systems reviewed and are negative.   Blood pressure 107/81, pulse (!) 120, temperature 98.5 F (36.9 C), temperature source Oral, resp. rate 18, height 5\' 2"  (1.575 m), weight 111.6 kg (246 lb), SpO2 99 %.Body mass index is 44.99 kg/m.  General Appearance: Casual  Eye Contact:  Fair  Speech:  Clear and Coherent  Volume:  Normal  Mood:  Anxious and Depressed  Affect:  Congruent  Thought Process:  Goal Directed and Descriptions of Associations: Intact  Orientation:  Full (Time, Place, and Person)  Thought Content:  Rumination  Suicidal Thoughts:  on and off , yes  Homicidal Thoughts:  No  Memory:  Immediate;   Fair Recent;   Fair Remote;   Fair  Judgement:  Fair  Insight:  Fair  Psychomotor Activity:  Normal  Concentration:  Concentration: Fair and Attention Span: Fair  Recall:  [employer] of Knowledge:  Fair  Language:  Fair  Akathisia:  No  Handed:  Right  AIMS (if indicated):     Assets:  Desire for Improvement  ADL's:  Intact  Cognition:  WNL  Sleep:  Number of Hours: 6.75     Treatment Plan Summary:Patient presented with worsening depression, anxiety, SI - on and off , was admitted to IM service for sepsis , later on transferred back to [facility] for further management of her mental illness. Pt continues to need medication readjustment - since she seems anxious , dysphoric and has passive SI . Continue to encourage and support.  Schizoaffective disorder, depressive type (HCC) improving  Will continue today 02/13/17 plan as below except where it is noted.   Daily contact with patient to assess and evaluate symptoms and progress in treatment, Medication management and Plan see below For affective sx: Cymbalta 40 mg po daily. Increase Seroquel to 200  mg po qhs for augmenting the cymbalta as well as for psychosis.  For insomnia: Increase Seroquel to 200 mg po qhs.  For anxiety sx: Ativan PRN as per MAR.   For recent SIRS 2/2 Influenza: Continue  Tamiflu as per IM recommendations - last dose on 02/13/17, for total 5 days.  For seizure do : Phenytoin 200 mg po bid . Next phenytoin level on Friday - 02/14/17 - AM. Phenytoin per pharmacy consult.  For Essential HTN: Will  Restart Metoprolol 25 mg po daily - dose to be titrated up as needed. Pt is tachycardic - will monitor on the unit .  For Sick euthyroid , fT4 wnl: Repeat TFT in 4 weeks .  For Normocytic anemia: Repeat hemoglobin as outpatient.  Will continue to monitor vitals ,medication compliance and treatment side effects while patient is here.   Will monitor for medical issues as well as call consult as needed.   Reviewed labs ,will order as noted above, EKG reviewed -NSR.  CSW will continue  working on disposition.   Patient to participate in therapeutic milieu .  Briggette Najarian, MD 02/13/2017, 11:34 AM

## 2017-02-13 NOTE — [facility] Suicide Risk Assessment (Signed)
[facility] INPATIENT:  Family/Significant Other Suicide Prevention Education  Suicide Prevention Education:  Patient Refusal for Family/Significant Other Suicide Prevention Education: The patient Katherine Morse has refused to provide written consent for family/significant other to be provided Family/Significant Other Suicide Prevention Education during admission and/or prior to discharge.  Physician notified.  Patient stated "I do not have anyone for you to call"   Baldo DaubJolan Tilford Deaton 02/13/2017, 10:18 AM

## 2017-02-13 NOTE — Social Work (Signed)
Referred to [facility], is [place] resident.  Anne Cunningham, LCSW Lead Clinical Social Worker Phone:  [phone]  

## 2017-02-13 NOTE — Progress Notes (Signed)
DAR NOTE: Patient presents with anxious affect and mood.  Denies pain, auditory and visual hallucinations.  Described energy level as low and concentration as good.  Rates depression at 7, hopelessness at 8, and anxiety at 6.  Maintained on routine safety checks.  Medications given as prescribed.  Support and encouragement offered as needed.  Attended group and participated.  States goal for today is "my depression and getting myself together."  Patient observed socializing with peers in the dayroom.  Motrin 200 mg given for complain of back pain with good effect.  Respiratory treatment given for complain of wheezing with good result.  Patient placed on droplet precaution per infection control protocol.  Patient instructed to wear face mask when out of her room.

## 2017-02-14 LAB — CULTURE, BLOOD (ROUTINE X 2)
CULTURE: NO GROWTH
Culture: NO GROWTH

## 2017-02-14 MED ORDER — QUETIAPINE FUMARATE 400 MG PO TABS
400.0000 mg | ORAL_TABLET | Freq: Every day | ORAL | Status: DC
  Administered 2017-02-14 – 2017-02-16 (×3): 400 mg via ORAL
  Filled 2017-02-14 (×4): qty 1

## 2017-02-14 NOTE — Plan of Care (Signed)
Problem: Safety: Goal: Periods of time without injury will increase Outcome: Progressing Periods of time without injury will increase AEB [id number] safety checks and medication education.

## 2017-02-14 NOTE — Progress Notes (Signed)
Recreation Therapy Notes  Date: 02/14/17 Time: 1000 Location: 500 Hall Dayroom   Group Topic: Communication, Team Building, Problem Solving  Goal Area(s) Addresses:  Patient will effectively work with peer towards shared goal.  Patient will identify skill used to make activity successful.  Patient will identify how skills used during activity can be used to reach post d/c goals.   Behavioral Response: None  Intervention: STEM Activity   Activity: [place] [employer]. Patients were provided the following materials: 5 drinking straws, 5 rubber bands, 5 paper clips, 2 index cards, 2 drinking cups, and 2 toilet paper rolls. Using the provided materials patients were asked to build a launching mechanisms to launch a ping pong ball approximately 12 feet. Patients were divided into teams of 3-5.   Education: Pharmacist, communityocial Skills, Building control surveyorDischarge Planning.   Education Outcome: Acknowledges education/In group clarification offered/Needs additional education.   Clinical Observations/Feedback: Pt was in group but left before the activity started with Child psychotherapistsocial worker and did not return.   Caroll RancherMarjette Seaira Byus, LRT/CTRS      Lillia AbedLindsay, Christophe Rising A 02/14/2017 11:22 AM

## 2017-02-14 NOTE — Progress Notes (Signed)
[facility] MD Progress Note  02/14/2017 11:37 AM Katherine Morse  MRN:  [id number] Subjective:  Pt states " I am still not sleeping , I still feel depressed due to that.'     Objective:Patient seen and chart reviewed.Discussed patient with treatment team.  Pt today is seen as depressed, dysphoric , reports continued rumination about the deaths in her family , but she is not too preoccupied . Pt today seen as wearing a mask , is on droplet precaution for her recent Influenza diagnosis. Pt denies any sx or concerns at this time regarding her flu. Pt however continues to have sleep issues , her seroquel at 200 mg is not effective. Pt reports she was at a higher dose previously , will increase the dose tonight, Continue to offer support and encouragement.    Principal Problem: Schizoaffective disorder, depressive type (HCC) Diagnosis:   Patient Active Problem List   Diagnosis Date Noted  . PTSD (post-traumatic stress disorder) [F43.10] 02/12/2017  . Sepsis (HCC) [A41.9] 02/08/2017  . Influenza A [J10.1] 02/08/2017  . Convulsions/seizures (HCC) [R56.9] 02/07/2017  . Essential hypertension [I10] 02/07/2017  . Schizoaffective disorder, depressive type (HCC) [F25.1] 02/06/2017   Total Time spent with patient: 25 minutes  Past Psychiatric History: Please see H&P.   Past Medical History:  Past Medical History:  Diagnosis Date  . Anxiety   . Bipolar 1 disorder (HCC)   . Depression   . Hypertension   . Schizophrenia (HCC)   . Seizures (HCC)     Past Surgical History:  Procedure Laterality Date  . ABLATION    . CESAREAN SECTION     Family History:  Family History  Problem Relation Age of Onset  . Hypertension Mother   . Seizures Mother   . Mental illness Son   . Seizures Sister   . Alcoholism Cousin    Family Psychiatric  History: Please see H&P.  Social History: Please see H&P.  History  Alcohol Use No     History  Drug Use No    Social History   Social History  .  Marital status: Single    Spouse name: N/A  . Number of children: N/A  . Years of education: N/A   Social History Main Topics  . Smoking status: Former Games developer  . Smokeless tobacco: Never Used  . Alcohol use No  . Drug use: No  . Sexual activity: Not Asked   Other Topics Concern  . None   Social History Narrative  . None   Additional Social History:    Pain Medications: See PTA medication list Prescriptions: See PTA medication list, Dilantin, Cymbalta and one that she has not gotten filled yet. Topolol, Gabapentin, Lynsess, Prilosec Over the Counter: See PTA medication list History of alcohol / drug use?: No history of alcohol / drug abuse                    Sleep: Poor  Appetite:  Fair  Current Medications: Current Facility-Administered Medications  Medication Dose Route Frequency Provider Last Rate Last Dose  . acetaminophen (TYLENOL) tablet 650 mg  650 mg Oral Q6H PRN Sanjuana Kava, NP      . albuterol (PROVENTIL) (2.5 MG/3ML) 0.083% nebulizer solution 2.5 mg  2.5 mg Nebulization Q4H PRN Jomarie Longs, MD   2.5 mg at 02/13/17 1443  . alum & mag hydroxide-simeth (MAALOX/MYLANTA) 200-200-20 MG/5ML suspension 30 mL  30 mL Oral Q4H PRN Sanjuana Kava, NP   30 mL at  02/12/17 1354  . DULoxetine (CYMBALTA) DR capsule 40 mg  40 mg Oral Daily Sanjuana KavaAgnes I Nwoko, NP   40 mg at 02/14/17 0819  . gabapentin (NEURONTIN) capsule 100 mg  100 mg Oral TID Sanjuana KavaAgnes I Nwoko, NP   100 mg at 02/14/17 0819  . guaiFENesin-dextromethorphan (ROBITUSSIN DM) 100-10 MG/5ML syrup 5 mL  5 mL Oral Q4H PRN Sanjuana KavaAgnes I Nwoko, NP      . ibuprofen (ADVIL,MOTRIN) tablet 200 mg  200 mg Oral Q4H PRN Sanjuana KavaAgnes I Nwoko, NP   200 mg at 02/14/17 0959  . LORazepam (ATIVAN) tablet 1 mg  1 mg Oral Q6H PRN Jomarie LongsSaramma Chyane Greer, MD   1 mg at 02/13/17 2308   Or  . LORazepam (ATIVAN) injection 1 mg  1 mg Intramuscular Q6H PRN Zacharias Ridling, MD      . magnesium hydroxide (MILK OF MAGNESIA) suspension 30 mL  30 mL Oral Daily PRN Sanjuana KavaAgnes  I Nwoko, NP      . metoprolol tartrate (LOPRESSOR) tablet 25 mg  25 mg Oral Daily Kendarius Vigen, MD   25 mg at 02/13/17 1704  . ondansetron (ZOFRAN) tablet 4 mg  4 mg Oral Q8H PRN Sanjuana KavaAgnes I Nwoko, NP      . pantoprazole (PROTONIX) EC tablet 40 mg  40 mg Oral Daily Sanjuana KavaAgnes I Nwoko, NP   40 mg at 02/14/17 0820  . phenytoin (DILANTIN) ER capsule 200 mg  200 mg Oral BID Sanjuana KavaAgnes I Nwoko, NP   200 mg at 02/14/17 [id number]  . QUEtiapine (SEROQUEL) tablet 400 mg  400 mg Oral QHS Maliik Karner, MD      . saccharomyces boulardii (FLORASTOR) capsule 250 mg  250 mg Oral BID Jomarie LongsSaramma Brinda Focht, MD   250 mg at 02/14/17 [id number]    Lab Results: No results found for this or any previous visit (from the past 48 hour(s)).  Blood Alcohol level:  Lab Results  Component Value Date   ETH <5 02/05/2017    Metabolic Disorder Labs: Lab Results  Component Value Date   HGBA1C 4.4 (L) 02/08/2017   MPG 80 02/08/2017   Lab Results  Component Value Date   PROLACTIN 23.6 (H) 02/08/2017   Lab Results  Component Value Date   CHOL 206 (H) 02/08/2017   TRIG 84 02/08/2017   HDL 63 02/08/2017   CHOLHDL 3.3 02/08/2017   VLDL 17 02/08/2017   LDLCALC 126 (H) 02/08/2017    Physical Findings: AIMS: Facial and Oral Movements Muscles of Facial Expression: None, normal Lips and Perioral Area: None, normal Jaw: None, normal Tongue: None, normal,Extremity Movements Upper (arms, wrists, hands, fingers): None, normal Lower (legs, knees, ankles, toes): None, normal, Trunk Movements Neck, shoulders, hips: None, normal, Overall Severity Severity of abnormal movements (highest score from questions above): None, normal Incapacitation due to abnormal movements: None, normal Patient's awareness of abnormal movements (rate only patient's report): No Awareness, Dental Status Current problems with teeth and/or dentures?: No Does patient usually wear dentures?: No  CIWA:    COWS:     Musculoskeletal: Strength & Muscle Tone: within normal  limits Gait & Station: normal Patient leans: N/A  Psychiatric Specialty Exam: Physical Exam  Nursing note and vitals reviewed.   Review of Systems  Psychiatric/Behavioral: Positive for depression. The patient has insomnia.   All other systems reviewed and are negative.   Blood pressure (!) 96/56, pulse (!) 101, temperature 98 F (36.7 C), temperature source Oral, resp. rate 16, height 5\' 2"  (1.575 m), weight 111.6 kg (246  lb), SpO2 99 %.Body mass index is 44.99 kg/m.  General Appearance: Casual  Eye Contact:  Fair  Speech:  Clear and Coherent  Volume:  Normal  Mood:  Anxious and Dysphoric  Affect:  Appropriate  Thought Process:  Goal Directed and Descriptions of Associations: Intact  Orientation:  Full (Time, Place, and Person)  Thought Content:  Rumination  Suicidal Thoughts:  No  Homicidal Thoughts:  No  Memory:  Immediate;   Fair Recent;   Fair Remote;   Fair  Judgement:  Fair  Insight:  Fair  Psychomotor Activity:  Normal  Concentration:  Concentration: Fair and Attention Span: Fair  Recall:  [employer] of Knowledge:  Fair  Language:  Fair  Akathisia:  No  Handed:  Right  AIMS (if indicated):     Assets:  Desire for Improvement  ADL's:  Intact  Cognition:  WNL  Sleep:  Number of Hours: 4.25     Treatment Plan Summary:Patient today seen as depressed, preoccupied with her sleep issues , discussed making readjustments with medications .  Schizoaffective disorder, depressive type (HCC) improving , continues to have unstable sleep and is depressed.  Will continue today 02/14/17 plan as below except where it is noted.   Daily contact with patient to assess and evaluate symptoms and progress in treatment, Medication management and Plan see below For affective sx: Cymbalta 40 mg po daily. Will increase seroquel to 400 mg po qhs for augmenting the effect of cymbalta as well as for psychosis.   For insomnia: Will increase Seroquel to 400 mg po qhs.  For  anxiety sx: Ativan PRN as per MAR.   For recent SIRS 2/2 Influenza: Pt is on droplet precaution - recommendations from ID . Continue Tamiflu as per IM recommendations - last dose on 02/13/17, for total 5 days.  For seizure do : Will continue Phenytoin 200 mg po bid . Pending phenytoin level - today - 02/14/17 - will reassess dose once we get the result. Phenytoin per pharmacy consult.  For Essential HTN: Will  Restart Metoprolol 25 mg po daily - dose to be titrated up as needed. Pt is tachycardic - will monitor on the unit .  For Sick euthyroid , fT4 wnl: Repeat TFT in 4 weeks .  For Normocytic anemia: Repeat hemoglobin as outpatient.  Will continue to monitor vitals ,medication compliance and treatment side effects while patient is here.   Will monitor for medical issues as well as call consult as needed.   Reviewed labs ,will order as noted above, EKG reviewed -NSR.  CSW will continue  working on disposition.   Patient to participate in therapeutic milieu .  Jeaneane Adamec, MD 02/14/2017, 11:37 AM

## 2017-02-14 NOTE — Progress Notes (Signed)
DAR NOTE: Patient presents with anxious affect and depressed mood.  Denies pain, auditory and visual hallucinations.  Described energy level as low and concentration as good.  Rates depression at 9, hopelessness at 2, and anxiety at 5.  Maintained on routine safety checks.  Medications given as prescribed.  Support and encouragement offered as needed.  Attended group and participated.  States goal for today is "just getting myself better."  Patient observed socializing with peers in the dayroom.  Motrin 200 mg given for complain of back pain with good effect.

## 2017-02-14 NOTE — Plan of Care (Signed)
Problem: Health Behavior/Discharge Planning: Goal: Compliance with treatment plan for underlying cause of condition will improve Outcome: Progressing Patient taking medications as prescribed. Medications reviewed with patient. Patient verbalized understanding and denies any adverse drug reactions.

## 2017-02-14 NOTE — [facility] Group Notes (Signed)
[facility] LCSW Group Therapy  02/14/2017 1:13 PM   Type of Therapy:  Group Therapy  Participation Level:  Active  Participation Quality:  Attentive  Affect:  Appropriate  Cognitive:  Appropriate  Insight:  Improving  Engagement in Therapy:  Engaged  Modes of Intervention:  Clarification, Education, Exploration and Socialization  Summary of Progress/Problems: Today's group focused on relapse prevention.  We defined the term, and then brainstormed on ways to prevent relapse.  "I hope that I am able to find me my own place, where other people are not taking my money for drugs."  Went on to talk about grandchildren and how excited she is to be near them again.  Katherine Morse, Katherine Morse 02/14/2017 , 1:13 PM

## 2017-02-14 NOTE — Progress Notes (Signed)
Nursing Progress Note 7p-7a  D) Patient presents assertive and anxious. Patient is cooperative and pleasant with Clinical research associatewriter. Patient anxiously requests sleeping medications at 2000 stating "I just want to go to bed. I had trouble sleeping last night but I think they increased my medicine". Patient informed medications could not be given until 2100. Patient verbalized understanding. Patient able to express needs to writer appropriately. Patient seen interacting with peers in the day room. Patient denies SI/HI/AVH but endorses minimal back pain. Patient contracts for safety at this time.  A) Emotional support given. 1:1 interaction and active listening provided. Patient medicated with PM orders as prescribed. Medications reviewed with patient. Patient verbalized understanding of medications without further questions.  Snacks and fluids provided. Opportunities for questions or concerns presented to patient. Patient encouraged to continue to work on treatment goals. Labs, vital signs and patient behavior monitored throughout shift. Patient safety maintained with q15 min safety checks.  R) Patient receptive to interaction with nurse. Patient remains safe on the unit at this time. Patient denies any adverse medication reactions at this time. Patient is resting in bed without complaints. Will continue to monitor.

## 2017-02-15 DIAGNOSIS — Z87891 Personal history of nicotine dependence: Secondary | ICD-10-CM

## 2017-02-15 MED ORDER — FERROUS SULFATE 325 (65 FE) MG PO TABS
325.0000 mg | ORAL_TABLET | Freq: Two times a day (BID) | ORAL | Status: DC
  Administered 2017-02-15 – 2017-02-16 (×2): 325 mg via ORAL
  Filled 2017-02-15 (×6): qty 1

## 2017-02-15 NOTE — [facility] Group Notes (Signed)
Adult Therapy Group Note  Date:  02/15/2017  Time:   11:15AM-12:00PM  Group Topic/Focus: Unhealthy vs Healthy Coping Techniques  Building Self Esteem:    The focus of this group was to discuss healthy and unhealthy coping techniques and become aware of the differences between the two.   Participants were invited to share ideas about how to incorporate more healthy coping techniques.  Motivational Interviewing was used to highlight reasons for change and resistance to change.  Participation Level:  Active  Participation Quality:  Attentive   Affect:  Blunted  Cognitive:  Appropriate  Insight:  Limited  Engagement in Group:  Limited  Modes of Intervention:  Motivational Interviewing, Discussion and Support  Additional Comments:  The patient expressed very little during group, but would talk on topic if called on directly.  Ambrose MantleMareida Grossman-Orr, LCSW 02/15/2017   1:11 PM

## 2017-02-15 NOTE — Plan of Care (Signed)
Problem: Activity: Goal: Interest or engagement in activities will improve Outcome: Progressing Patient observed up in the dayroom. Patient attended group and was engaged/appropriate.

## 2017-02-15 NOTE — Progress Notes (Signed)
DAR NOTE: Patient presents with anxious affect and depressed mood.  Denies pain, auditory and visual hallucinations.  Rates depression at 4, hopelessness at 0, and anxiety at 0.  Maintained on routine safety checks.  Medications given as prescribed.  Support and encouragement offered as needed.  Attended group and participated.  States goal for today is "keep getting better and continue to be hopeful."  Patient observed socializing with peers in the dayroom.  Motrin 200 mg given for back pain with good effect.

## 2017-02-15 NOTE — Progress Notes (Signed)
[facility]  02/15/2017 1:04 PM Katherine Morse  MRN:  [id number]   Subjective:  Patient reports " I am feeling better, I hope I can get to [place], I feel tired I need to start taking iron pills again"   Objective: Vedanshi Augustine is awake, alert and oriented *3. Patient seen resting in dayroom interacting with peers.  Denies suicidal or homicidal ideation. Denies auditory or visual hallucination and does not appear to be responding to internal stimuli. Patient reports she is medication compliant without mediation side effects.  Reports some mild depression and states she want to move out of her current living situation. reports good appetite ans states she is  resting well "okay" Support, encouragement and reassurance was provided.   Principal Problem: Schizoaffective disorder, depressive type (HCC) Diagnosis:   Patient Active Problem List   Diagnosis Date Noted  . PTSD (post-traumatic stress disorder) [F43.10] 02/12/2017  . Sepsis (HCC) [A41.9] 02/08/2017  . Influenza A [J10.1] 02/08/2017  . Convulsions/seizures (HCC) [R56.9] 02/07/2017  . Essential hypertension [I10] 02/07/2017  . Schizoaffective disorder, depressive type (HCC) [F25.1] 02/06/2017   Total Time spent with patient: 25 minutes  Past Psychiatric History: Please see H&P.   Past Medical History:  Past Medical History:  Diagnosis Date  . Anxiety   . Bipolar 1 disorder (HCC)   . Depression   . Hypertension   . Schizophrenia (HCC)   . Seizures (HCC)     Past Surgical History:  Procedure Laterality Date  . ABLATION    . CESAREAN SECTION     Family History:  Family History  Problem Relation Age of Onset  . Hypertension Mother   . Seizures Mother   . Mental illness Son   . Seizures Sister   . Alcoholism Cousin    Family Psychiatric  History: Please see H&P.  Social History: Please see H&P.  History  Alcohol Use No     History  Drug Use No    Social History   Social History  .  Marital status: Single    Spouse name: N/A  . Number of children: N/A  . Years of education: N/A   Social History Main Topics  . Smoking status: Former Games developermoker  . Smokeless tobacco: Never Used  . Alcohol use No  . Drug use: No  . Sexual activity: Not Asked   Other Topics Concern  . None   Social History Narrative  . None   Additional Social History:    Pain Medications: See PTA medication list Prescriptions: See PTA medication list, Dilantin, Cymbalta and one that she has not gotten filled yet. Topolol, Gabapentin, Lynsess, Prilosec Over the Counter: See PTA medication list History of alcohol / drug use?: No history of alcohol / drug abuse                    Sleep: Poor  Appetite:  Fair  Current Medications: Current Facility-Administered Medications  Medication Dose Route Frequency Provider Last Rate Last Dose  . acetaminophen (TYLENOL) tablet 650 mg  650 mg Oral Q6H PRN Sanjuana KavaAgnes I Nwoko, NP      . albuterol (PROVENTIL) (2.5 MG/3ML) 0.083% nebulizer solution 2.5 mg  2.5 mg Nebulization Q4H PRN Jomarie LongsSaramma Eappen, MD   2.5 mg at 02/14/17 1847  . alum & mag hydroxide-simeth (MAALOX/MYLANTA) 200-200-20 MG/5ML suspension 30 mL  30 mL Oral Q4H PRN Sanjuana KavaAgnes I Nwoko, NP   30 mL at 02/12/17 1354  . DULoxetine (CYMBALTA) DR capsule 40  mg  40 mg Oral Daily Sanjuana Kava, NP   40 mg at 02/15/17 0743  . gabapentin (NEURONTIN) capsule 100 mg  100 mg Oral TID Sanjuana Kava, NP   100 mg at 02/15/17 1252  . guaiFENesin-dextromethorphan (ROBITUSSIN DM) 100-10 MG/5ML syrup 5 mL  5 mL Oral Q4H PRN Sanjuana Kava, NP      . ibuprofen (ADVIL,MOTRIN) tablet 200 mg  200 mg Oral Q4H PRN Sanjuana Kava, NP   200 mg at 02/14/17 0959  . LORazepam (ATIVAN) tablet 1 mg  1 mg Oral Q6H PRN Jomarie Longs, MD   1 mg at 02/14/17 1201   Or  . LORazepam (ATIVAN) injection 1 mg  1 mg Intramuscular Q6H PRN Saramma Eappen, MD      . magnesium hydroxide (MILK OF MAGNESIA) suspension 30 mL  30 mL Oral Daily PRN Sanjuana Kava, NP      . metoprolol tartrate (LOPRESSOR) tablet 25 mg  25 mg Oral Daily Saramma Eappen, MD   25 mg at 02/13/17 1704  . ondansetron (ZOFRAN) tablet 4 mg  4 mg Oral Q8H PRN Sanjuana Kava, NP      . pantoprazole (PROTONIX) EC tablet 40 mg  40 mg Oral Daily Sanjuana Kava, NP   40 mg at 02/15/17 0743  . phenytoin (DILANTIN) ER capsule 200 mg  200 mg Oral BID Sanjuana Kava, NP   200 mg at 02/15/17 0743  . QUEtiapine (SEROQUEL) tablet 400 mg  400 mg Oral QHS Jomarie Longs, MD   400 mg at 02/14/17 2101  . saccharomyces boulardii (FLORASTOR) capsule 250 mg  250 mg Oral BID Jomarie Longs, MD   250 mg at 02/15/17 1610    Lab Results: No results found for this or any previous visit (from the past 48 hour(s)).  Blood Alcohol level:  Lab Results  Component Value Date   ETH <5 02/05/2017    Metabolic Disorder Labs: Lab Results  Component Value Date   HGBA1C 4.4 (L) 02/08/2017   MPG 80 02/08/2017   Lab Results  Component Value Date   PROLACTIN 23.6 (H) 02/08/2017   Lab Results  Component Value Date   CHOL 206 (H) 02/08/2017   TRIG 84 02/08/2017   HDL 63 02/08/2017   CHOLHDL 3.3 02/08/2017   VLDL 17 02/08/2017   LDLCALC 126 (H) 02/08/2017    Physical Findings: AIMS: Facial and Oral Movements Muscles of Facial Expression: None, normal Lips and Perioral Area: None, normal Jaw: None, normal Tongue: None, normal,Extremity Movements Upper (arms, wrists, hands, fingers): None, normal Lower (legs, knees, ankles, toes): None, normal, Trunk Movements Neck, shoulders, hips: None, normal, Overall Severity Severity of abnormal movements (highest score from questions above): None, normal Incapacitation due to abnormal movements: None, normal Patient's awareness of abnormal movements (rate only patient's report): No Awareness, Dental Status Current problems with teeth and/or dentures?: No Does patient usually wear dentures?: No  CIWA:    COWS:     Musculoskeletal: Strength &  Muscle Tone: within normal limits Gait & Station: normal Patient leans: N/A  Psychiatric Specialty Exam: Physical Exam  Nursing note and vitals reviewed. Constitutional: She is oriented to person, place, and time. She appears well-developed.  Cardiovascular: Normal rate.   Neurological: She is alert and oriented to person, place, and time.  Psychiatric: She has a normal mood and affect. Her behavior is normal.    Review of Systems  Psychiatric/Behavioral: Positive for depression. The patient has insomnia.  All other systems reviewed and are negative.   Blood pressure 94/70, pulse (!) 107, temperature 98.9 F (37.2 C), temperature source Oral, resp. rate 20, height 5\' 2"  (1.575 m), weight 111.6 kg (246 lb), SpO2 99 %.Body mass index is 44.99 kg/m.  General Appearance: Casual paper scrubs  Eye Contact:  Good  Speech:  Clear and Coherent  Volume:  Normal  Mood:  Anxious and Depressed  Affect:  Congruent  Thought Process:  Goal Directed  Orientation:  Full (Time, Place, and Person)  Thought Content:  Hallucinations: None and Rumination  Suicidal Thoughts:  No  Homicidal Thoughts:  No  Memory:  Immediate;   Fair Remote;   Fair  Judgement:  Fair  Insight:  Fair  Psychomotor Activity:  Normal  Concentration:  Concentration: Fair and Attention Span: Fair  Recall:  [employer] of Knowledge:  Fair  Language:  Fair  Akathisia:  No  Handed:  Right  AIMS (if indicated):     Assets:  Desire for Improvement Resilience Social Support  ADL's:  Intact  Cognition:  WNL  Sleep:  Number of Hours: 6.25     I agree with current treatment plan on 02/15/2017, Patient seen face-to-face for psychiatric evaluation follow-up, chart reviewed. Reviewed the information documented and agree with the treatment plan.  Treatment Plan Summary: Daily contact with patient to assess and evaluate symptoms and progress in treatment, Medication management and Plan see below  Schizoaffective disorder,  depressive type (HCC) improving , continues to have unstable sleep and is depressed.  Will continue today 02/15/17 plan as below except where it is noted.  Labs: Phenytoin  1.3 (low) on 2/12- pending results of current level  - TIBC/IRON level-pending results  For affective sx: Cymbalta 40 mg po daily. Will contiune seroquel to 400 mg po qhs for augmenting the effect of Cymbalta as well as for psychosis.  Anemia:  Start ferrous sulfate 325 mg PO BID for low CBC Pending TIBC   For insomnia: Will continue Seroquel to 400 mg po qhs.  For anxiety sx: Ativan PRN as per MAR.   For recent SIRS 2/2 Influenza: Pt is on droplet precaution - recommendations from ID . Continue Tamiflu as per IM recommendations - last dose on 02/13/17, for total 5 days.  For seizure do : Will continue Phenytoin 200 mg po bid . Pending phenytoin level - today - 02/14/17 - will reassess dose once we get the result. Phenytoin per pharmacy consult.  For Essential HTN: Will  Restart Metoprolol 25 mg po daily - dose to be titrated up as needed. Pt is tachycardic - will monitor on the unit .  For Sick euthyroid , fT4 wnl: Repeat TFT in 4 weeks .  For Normocytic anemia: Repeat hemoglobin as outpatient.  Will continue to monitor vitals ,medication compliance and treatment side effects while patient is here.   Will monitor for medical issues as well as call consult as needed.   Reviewed labs ,will order as noted above, EKG reviewed -NSR.  CSW will continue  working on disposition.   Patient to participate in therapeutic milieu .  Oneta Rack, NP 02/15/2017, 1:04 PM

## 2017-02-15 NOTE — Progress Notes (Signed)
Nursing Progress Note 7p-7a  D) Patient presents flat but is pleasant and cooperative. Patient states she had "an okay day". Patient observed in the dayroom having snacks. Patient minimally interacts with peers. Patient BP low, 89/50. Patient given Gatorade and rechecked, BP increased to 118/73. Patient requesting ativan for anxiety and states "my side is starting to ache from all this coughing". Patient reports production of small amounts of white mucus from her cough. Patient denies SI/HI/AVH or moderate pain. Patient contracts for safety at this time. Patient did attend group.  A) Emotional support given. 1:1 interaction and active listening provided. Patient medicated with PM orders as prescribed. Medications reviewed with patient. Patient verbalized understanding of medications without further questions.  Snacks and fluids provided. Opportunities for questions or concerns presented to patient. Patient encouraged to continue to work on treatment goals. Labs, vital signs and patient behavior monitored throughout shift. Patient safety maintained with q15 min safety checks.  R) Patient receptive to interaction with nurse. Patient remains safe on the unit at this time. Patient denies any adverse medication reactions at this time. Patient is resting in bed without complaints. Will continue to monitor.

## 2017-02-15 NOTE — Progress Notes (Signed)
Adult Psychoeducational Group Note  Date:  02/15/2017 Time:  8:28 PM  Group Topic/Focus:  Wrap-Up Group:   The focus of this group is to help patients review their daily goal of treatment and discuss progress on daily workbooks.  Participation Level:  Active  Participation Quality:  Appropriate  Affect:  Appropriate  Cognitive:  Appropriate  Insight: Appropriate  Engagement in Group:  Engaged  Modes of Intervention:  Discussion  Additional Comments:  The patient expressed that she had a good day .The patient also said that she attended all groups.  Octavio Mannshigpen, Anola Mcgough Lee 02/15/2017, 8:28 PM

## 2017-02-16 LAB — IRON AND TIBC
Iron: 51 ug/dL (ref 28–170)
Saturation Ratios: 17 % (ref 10.4–31.8)
TIBC: 304 ug/dL (ref 250–450)
UIBC: 253 ug/dL

## 2017-02-16 MED ORDER — FERROUS SULFATE 325 (65 FE) MG PO TABS
325.0000 mg | ORAL_TABLET | Freq: Every day | ORAL | Status: DC
  Administered 2017-02-17 – 2017-02-18 (×2): 325 mg via ORAL
  Filled 2017-02-16 (×4): qty 1

## 2017-02-16 NOTE — Progress Notes (Signed)
Adult Psychoeducational Group Note  Date:  02/16/2017 Time:  8:56 PM  Group Topic/Focus:  Wrap-Up Group:   The focus of this group is to help patients review their daily goal of treatment and discuss progress on daily workbooks.  Participation Level:  Active  Participation Quality:  Appropriate and Attentive  Affect:  Appropriate  Cognitive:  Appropriate  Insight: Appropriate  Engagement in Group:  Engaged  Modes of Intervention:  Discussion  Additional Comments:  Pt states her goal was to attend groups, which she did. Pt stated she was able to talk to her grandbaby today, which she enjoyed. Pt was encouraged to make her needs known to staff.  Caswell CorwinOwen, Keanthony Poole C 02/16/2017, 8:56 PM

## 2017-02-16 NOTE — Progress Notes (Signed)
[facility] MD Progress Note  02/16/2017 11:47 AM Katherine Morse  MRN:  [id number]   Subjective:  Patient reports " I am doing okay today, I need to get my money out of the safe at [facility]."  Objective: Katherine Morse is awake, alert and oriented *3. Patient seen standing at the nursing station. Patient denies suicidal or homicidal ideation during this assessment.  Patient continues to endorse mild depression, overall reports she feels ready for discharge.  reports good appetite ans states she rested well on last night.Denies auditory or visual hallucination and does not appear to be responding to internal stimuli. Patient reports she is medication compliant without mediation side effects. Support, encouragement and reassurance was provided.   Principal Problem: Schizoaffective disorder, depressive type (HCC) Diagnosis:   Patient Active Problem List   Diagnosis Date Noted  . PTSD (post-traumatic stress disorder) [F43.10] 02/12/2017  . Sepsis (HCC) [A41.9] 02/08/2017  . Influenza A [J10.1] 02/08/2017  . Convulsions/seizures (HCC) [R56.9] 02/07/2017  . Essential hypertension [I10] 02/07/2017  . Schizoaffective disorder, depressive type (HCC) [F25.1] 02/06/2017   Total Time spent with patient: 25 minutes  Past Psychiatric History: Please see H&P.   Past Medical History:  Past Medical History:  Diagnosis Date  . Anxiety   . Bipolar 1 disorder (HCC)   . Depression   . Hypertension   . Schizophrenia (HCC)   . Seizures (HCC)     Past Surgical History:  Procedure Laterality Date  . ABLATION    . CESAREAN SECTION     Family History:  Family History  Problem Relation Age of Onset  . Hypertension Mother   . Seizures Mother   . Mental illness Son   . Seizures Sister   . Alcoholism Cousin    Family Psychiatric  History: Please see H&P.  Social History: Please see H&P.  History  Alcohol Use No     History  Drug Use No    Social History   Social History  . Marital status: Single     Spouse name: N/A  . Number of children: N/A  . Years of education: N/A   Social History Main Topics  . Smoking status: Former Games developer  . Smokeless tobacco: Never Used  . Alcohol use No  . Drug use: No  . Sexual activity: Not Asked   Other Topics Concern  . None   Social History Narrative  . None   Additional Social History:    Pain Medications: See PTA medication list Prescriptions: See PTA medication list, Dilantin, Cymbalta and one that she has not gotten filled yet. Topolol, Gabapentin, Lynsess, Prilosec Over the Counter: See PTA medication list History of alcohol / drug use?: No history of alcohol / drug abuse                    Sleep: Poor  Appetite:  Fair  Current Medications: Current Facility-Administered Medications  Medication Dose Route Frequency Provider Last Rate Last Dose  . acetaminophen (TYLENOL) tablet 650 mg  650 mg Oral Q6H PRN Sanjuana Kava, NP      . albuterol (PROVENTIL) (2.5 MG/3ML) 0.083% nebulizer solution 2.5 mg  2.5 mg Nebulization Q4H PRN Jomarie Longs, MD   2.5 mg at 02/14/17 1847  . alum & mag hydroxide-simeth (MAALOX/MYLANTA) 200-200-20 MG/5ML suspension 30 mL  30 mL Oral Q4H PRN Sanjuana Kava, NP   30 mL at 02/12/17 1354  . DULoxetine (CYMBALTA) DR capsule 40 mg  40 mg Oral Daily Sanjuana Kava,  NP   40 mg at 02/16/17 0908  . ferrous sulfate tablet 325 mg  325 mg Oral BID WC Oneta Rack, NP   325 mg at 02/16/17 0908  . gabapentin (NEURONTIN) capsule 100 mg  100 mg Oral TID Sanjuana Kava, NP   100 mg at 02/16/17 0909  . guaiFENesin-dextromethorphan (ROBITUSSIN DM) 100-10 MG/5ML syrup 5 mL  5 mL Oral Q4H PRN Sanjuana Kava, NP      . ibuprofen (ADVIL,MOTRIN) tablet 200 mg  200 mg Oral Q4H PRN Sanjuana Kava, NP   200 mg at 02/14/17 0959  . LORazepam (ATIVAN) tablet 1 mg  1 mg Oral Q6H PRN Jomarie Longs, MD   1 mg at 02/15/17 2121   Or  . LORazepam (ATIVAN) injection 1 mg  1 mg Intramuscular Q6H PRN Saramma Eappen, MD      . magnesium  hydroxide (MILK OF MAGNESIA) suspension 30 mL  30 mL Oral Daily PRN Sanjuana Kava, NP      . metoprolol tartrate (LOPRESSOR) tablet 25 mg  25 mg Oral Daily Saramma Eappen, MD   25 mg at 02/13/17 1704  . ondansetron (ZOFRAN) tablet 4 mg  4 mg Oral Q8H PRN Sanjuana Kava, NP      . pantoprazole (PROTONIX) EC tablet 40 mg  40 mg Oral Daily Sanjuana Kava, NP   40 mg at 02/16/17 0908  . phenytoin (DILANTIN) ER capsule 200 mg  200 mg Oral BID Sanjuana Kava, NP   200 mg at 02/16/17 0910  . QUEtiapine (SEROQUEL) tablet 400 mg  400 mg Oral QHS Jomarie Longs, MD   400 mg at 02/15/17 2057  . saccharomyces boulardii (FLORASTOR) capsule 250 mg  250 mg Oral BID Jomarie Longs, MD   250 mg at 02/16/17 0911    Lab Results:  Results for orders placed or performed during the hospital encounter of 02/12/17 (from the past 48 hour(s))  Iron and TIBC     Status: None   Collection Time: 02/16/17  6:16 AM  Result Value Ref Range   Iron 51 28 - 170 ug/dL   TIBC 161 096 - 045 ug/dL   Saturation Ratios 17 10.4 - 31.8 %   UIBC 253 ug/dL    Comment: Performed at [facility], [address], [place], [place] [address]    Blood Alcohol level:  Lab Results  Component Value Date   [facility] <5 02/05/2017    Metabolic Disorder Labs: Lab Results  Component Value Date   HGBA1C 4.4 (L) 02/08/2017   MPG 80 02/08/2017   Lab Results  Component Value Date   PROLACTIN 23.6 (H) 02/08/2017   Lab Results  Component Value Date   CHOL 206 (H) 02/08/2017   TRIG 84 02/08/2017   HDL 63 02/08/2017   CHOLHDL 3.3 02/08/2017   VLDL 17 02/08/2017   LDLCALC 126 (H) 02/08/2017    Physical Findings: AIMS: Facial and Oral Movements Muscles of Facial Expression: None, normal Lips and Perioral Area: None, normal Jaw: None, normal Tongue: None, normal,Extremity Movements Upper (arms, wrists, hands, fingers): None, normal Lower (legs, knees, ankles, toes): None, normal, Trunk Movements Neck, shoulders, hips: None, normal,  Overall Severity Severity of abnormal movements (highest score from questions above): None, normal Incapacitation due to abnormal movements: None, normal Patient's awareness of abnormal movements (rate only patient's report): No Awareness, Dental Status Current problems with teeth and/or dentures?: No Does patient usually wear dentures?: No  CIWA:  COWS:     Musculoskeletal: Strength & Muscle Tone: within normal limits Gait & Station: normal Patient leans: N/A  Psychiatric Specialty Exam: Physical Exam  Nursing note and vitals reviewed. Constitutional: She is oriented to person, place, and time. She appears well-developed.  Cardiovascular: Normal rate.   Neurological: She is alert and oriented to person, place, and time.  Psychiatric: She has a normal mood and affect. Her behavior is normal.    Review of Systems  Psychiatric/Behavioral: Positive for depression. The patient has insomnia.   All other systems reviewed and are negative.   Blood pressure 97/75, pulse (!) 131, temperature 98.5 F (36.9 C), temperature source Oral, resp. rate 20, height 5\' 2"  (1.575 m), weight 111.6 kg (246 lb), SpO2 99 %.Body mass index is 44.99 kg/m.  General Appearance: Casual and Fairly Groomed  Eye Contact:  Good  Speech:  Clear and Coherent and Normal Rate  Volume:  Normal  Mood:  Anxious and Depressed  Affect:  Congruent  Thought Process:  Goal Directed  Orientation:  Full (Time, Place, and Person)  Thought Content:  Hallucinations: None and Rumination  Suicidal Thoughts:  No  Homicidal Thoughts:  No  Memory:  Immediate;   Fair Recent;   Fair Remote;   Fair  Judgement:  Fair  Insight:  Fair  Psychomotor Activity:  Normal  Concentration:  Concentration: Fair and Attention Span: Fair  Recall:  [employer]  Fund of Knowledge:  Fair  Language:  Fair  Akathisia:  No  Handed:  Right  AIMS (if indicated):     Assets:  Desire for Improvement Housing Resilience Social Support  ADL's:   Intact  Cognition:  WNL  Sleep:  Number of Hours: 6.75     I agree with current treatment plan on 02/16/2017, Patient seen face-to-face for psychiatric evaluation follow-up, chart reviewed. Reviewed the information documented and agree with the treatment plan.  Treatment Plan Summary: Daily contact with patient to assess and evaluate symptoms and progress in treatment, Medication management and Plan see below  Schizoaffective disorder, depressive type (HCC) improving , continues to have unstable sleep and is depressed.  Will continue today 02/16/17 plan as below except where it is noted.  Labs: Phenytoin  1.3 (low) on 2/12- pending results of current level  - TIBC/IRON level; pending results  For affective sx: Cymbalta 40 mg po daily. Will contiune seroquel to 400 mg po qhs for augmenting the effect of Cymbalta as well as for psychosis.  Anemia:  Start ferrous sulfate 325 mg PO BID for low CBC Pending TIBC   For insomnia: Will continue Seroquel to 400 mg po qhs.  For anxiety sx: Ativan PRN as per MAR.   For recent SIRS 2/2 Influenza: Pt is on droplet precaution - recommendations from ID . Continue Tamiflu as per IM recommendations - last dose on 02/13/17, for total 5 days.  For seizure do : Will continue Phenytoin 200 mg po bid . Pending phenytoin level - today - 02/16/17 - (active in process) will reassess dose once we get the result. Phenytoin per pharmacy consult.  For Essential HTN: Will  Restart Metoprolol 25 mg po daily - dose to be titrated up as needed. Pt is tachycardic - will monitor on the unit .  For Sick euthyroid , fT4 wnl: Repeat TFT in 4 weeks .  For Normocytic anemia: Repeat hemoglobin as outpatient.  Will continue to monitor vitals ,medication compliance and treatment side effects while patient is here.   Will monitor for  medical issues as well as call consult as needed.   Reviewed labs ,will order as noted above, EKG reviewed  -NSR.  CSW will continue  working on disposition.   Patient to participate in therapeutic milieu .  Oneta Rack, NP 02/16/2017, 11:47 AM  Reviewed and agree with notes

## 2017-02-16 NOTE — [facility] Group Notes (Signed)
[facility] Group Notes:  (Clinical Social Work)  02/16/2017  11:00AM-12:00PM  Summary of Progress/Problems:  The main focus of today's process group was to listen to a variety of genres of music and to identify that different types of music provoke different responses.  The patient then was able to identify personally what was soothing for them, as well as energizing, as well as how patient can personally use this knowledge in sleep habits, with depression, and with other symptoms.  The patient expressed at the beginning of group the overall feeling of  "sleepy and tired" and at the end of group said she was "wide awake."  Type of Therapy:  Music Therapy   Participation Level:  Active  Participation Quality:  Attentive and Drowsy  Affect:  Blunted  Cognitive:  Oriented  Insight:  Engaged  Engagement in Therapy:  Engaged  Modes of Intervention:   Activity, Exploration  Ambrose MantleMareida Grossman-Orr, LCSW 02/16/2017

## 2017-02-16 NOTE — Progress Notes (Signed)
Patient ID: Katherine Morse, female   DOB: 12/19/1971, 46 y.o.   MRN: [id number]    D: Pt has been appropriate on the unit today, she attended all groups and engaged in treatment. Pt reported that depression was a 2, her hopelessness was a 0, and her anxiety was a 3. Pt reported that her goal for today was to stay in groups and work on getting home. Pt reported being negative SI/HI, no AH/VH noted. A: 15 min checks continued for patient safety. R: Pt safety maintained.

## 2017-02-16 NOTE — Progress Notes (Signed)
D   Pt is pleasant on approach and cooperative   She requested some ativan in addition to the seroquel because she said she wants to get a good night sleep   She said she has recovered well from the flu   Pt is compliant with treatment and is a group participant  A   Verbal support given   Medications administered and effectiveness monitored   Q 15 min checks R   Pt is safe and receptive to verbal support

## 2017-02-17 LAB — PHENYTOIN LEVEL, FREE AND TOTAL
Phenytoin, Free: NOT DETECTED ug/mL (ref 1.0–2.0)
Phenytoin, Total: 3.9 ug/mL — ABNORMAL LOW (ref 10.0–20.0)

## 2017-02-17 MED ORDER — QUETIAPINE FUMARATE 50 MG PO TABS
450.0000 mg | ORAL_TABLET | Freq: Every day | ORAL | Status: DC
  Administered 2017-02-17: 450 mg via ORAL
  Filled 2017-02-17 (×2): qty 1

## 2017-02-17 MED ORDER — ESZOPICLONE 2 MG PO TABS
1.0000 mg | ORAL_TABLET | Freq: Every day | ORAL | Status: DC
  Administered 2017-02-17: 1 mg via ORAL
  Filled 2017-02-17: qty 1

## 2017-02-17 MED ORDER — PHENYTOIN SODIUM EXTENDED 100 MG PO CAPS
300.0000 mg | ORAL_CAPSULE | Freq: Two times a day (BID) | ORAL | Status: DC
  Administered 2017-02-17 – 2017-02-18 (×2): 300 mg via ORAL
  Filled 2017-02-17 (×7): qty 3

## 2017-02-17 MED ORDER — METHOCARBAMOL 500 MG PO TABS
500.0000 mg | ORAL_TABLET | Freq: Three times a day (TID) | ORAL | Status: DC | PRN
  Administered 2017-02-17 – 2017-02-18 (×2): 500 mg via ORAL
  Filled 2017-02-17 (×2): qty 1

## 2017-02-17 MED ORDER — NON FORMULARY: 1.0000 mg | Freq: Every day | Status: DC

## 2017-02-17 NOTE — Progress Notes (Signed)
Recreation Therapy Notes  Date: 02/17/17 Time: 1000 Location: 500 Hall Dayroom  Group Topic: Wellness  Goal Area(s) Addresses:  Patient will define components of whole wellness. Patient will verbalize benefit of whole wellness.  Behavioral Response: Engaged  Intervention: Chair exercises, meditation  Activity: Event organiserChair Exercises, Meditation.  LRT introduced chair exercises as a way to get in some physical wellness and meditation as a way to exercise mental wellness.  LRT read script to allow patients to engage in both techniques. Patients were to follow along as LRT read from scripts to engage in both activities.  Education: Wellness, Building control surveyorDischarge Planning.   Education Outcome: Acknowledges education/In group clarification offered/Needs additional education.   Clinical Observations/Feedback: Pt stated that wellness was "getting enough sleep and taking your medication".  Pt expressed doing the chair exercises made her feel relaxed.  Pt wasn't able to focus during the meditation.     Caroll RancherMarjette Author [place], LRT/CTRS      Caroll RancherLindsay, Avonne Berkery A 02/17/2017 12:28 PM

## 2017-02-17 NOTE — [facility] Group Notes (Signed)
[facility] LCSW Group Therapy  02/17/2017 1:15 pm  Type of Therapy: Process Group Therapy  Participation Level:  Active  Participation Quality:  Appropriate  Affect:  Flat  Cognitive:  Oriented  Insight:  Improving  Engagement in Group:  Limited  Engagement in Therapy:  Limited  Modes of Intervention:  Activity, Clarification, Education, Problem-solving and Support  Summary of Progress/Problems: Today's group addressed the issue of overcoming obstacles.  Patients were asked to identify their biggest obstacle post d/c that stands in the way of their on-going success, and then problem solve as to how to manage this. Stayed the entire time, engaged throughout.  Stated she hates to go back to the same environment until the end of the month, but confirmed that now that she is back on meds, she will be able to tolerate it better.  Also talked about a plan to get out of the house as much as possible.  Katherine Morse, Katherine Morse 02/17/2017   4:16 PM

## 2017-02-17 NOTE — Progress Notes (Signed)
D: Pt present with depressed affect and anxious mood. A & O X3. C/O difficulty falling and staying asleep "I still can't sleep good". Denies SI, HI and AVH at time of assessment. Reports fair sleep, fair appetite, low energy and good concentration level on self inventory sheet.  Pt rates her depression 2/10, hopelessness 0/10 and anxiety 4/10.  A: Verbally encourage pt to voice concerns, comply with medications as ordered and attend scheduled unit groups. Scheduled and PRN (pain & muscle spasms) medications administered as ordered. Q 15 minutes safety checks maintained on and off unit without incident. R: Pt receptive to care. Compliant with medications when offered. Denies adverse drug reactions at this time. Attended unit groups this shift. Tolerates all PO intake well. POC continues for safety and mood stability and pt monitored as such.

## 2017-02-17 NOTE — Progress Notes (Addendum)
[facility] MD Progress Note  02/17/2017 12:31 PM Katherine Morse  MRN:  [id number]   Subjective: Pt states " I cannot sleep , I do not think seroquel is working."    Objective: Patient seen and chart reviewed.Discussed patient with treatment team.  Pt today seen as anxious , reports her sleep is till restless. Pt also has dizziness , which concerns her. Per RN - she continues to be hypotensive often and they have been encouraging oral fluids. Pt is on metoprolol which is currently being held. Pt denies any AH/VH - however continues to need a lot of encouragement and support. Pt is on phenytoin for seizure do - will continue to monitor . Her labs came back- level continues to be 1.3 - discussed with NP to contact hospitalist for further recommendations. Pending consult. Continue to encourage and support.   Principal Problem: Schizoaffective disorder, depressive type (HCC) Diagnosis:   Patient Active Problem List   Diagnosis Date Noted  . PTSD (post-traumatic stress disorder) [F43.10] 02/12/2017  . Sepsis (HCC) [A41.9] 02/08/2017  . Influenza A [J10.1] 02/08/2017  . Convulsions/seizures (HCC) [R56.9] 02/07/2017  . Essential hypertension [I10] 02/07/2017  . Schizoaffective disorder, depressive type (HCC) [F25.1] 02/06/2017   Total Time spent with patient: 25 minutes  Past Psychiatric History: Please see H&P.   Past Medical History:  Past Medical History:  Diagnosis Date  . Anxiety   . Bipolar 1 disorder (HCC)   . Depression   . Hypertension   . Schizophrenia (HCC)   . Seizures (HCC)     Past Surgical History:  Procedure Laterality Date  . ABLATION    . CESAREAN SECTION     Family History:  Family History  Problem Relation Age of Onset  . Hypertension Mother   . Seizures Mother   . Mental illness Son   . Seizures Sister   . Alcoholism Cousin    Family Psychiatric  History: Please see H&P.  Social History: Please see H&P.  History  Alcohol Use No     History  Drug  Use No    Social History   Social History  . Marital status: Single    Spouse name: N/A  . Number of children: N/A  . Years of education: N/A   Social History Main Topics  . Smoking status: Former Games developer  . Smokeless tobacco: Never Used  . Alcohol use No  . Drug use: No  . Sexual activity: Not Asked   Other Topics Concern  . None   Social History Narrative  . None   Additional Social History:    Pain Medications: See PTA medication list Prescriptions: See PTA medication list, Dilantin, Cymbalta and one that she has not gotten filled yet. Topolol, Gabapentin, Lynsess, Prilosec Over the Counter: See PTA medication list History of alcohol / drug use?: No history of alcohol / drug abuse                    Sleep: restless  Appetite:  Fair  Current Medications: Current Facility-Administered Medications  Medication Dose Route Frequency Provider Last Rate Last Dose  . acetaminophen (TYLENOL) tablet 650 mg  650 mg Oral Q6H PRN Sanjuana Kava, NP      . albuterol (PROVENTIL) (2.5 MG/3ML) 0.083% nebulizer solution 2.5 mg  2.5 mg Nebulization Q4H PRN Jomarie Longs, MD   2.5 mg at 02/16/17 1301  . alum & mag hydroxide-simeth (MAALOX/MYLANTA) 200-200-20 MG/5ML suspension 30 mL  30 mL Oral Q4H PRN Sanjuana Kava,  NP   30 mL at 02/12/17 1354  . DULoxetine (CYMBALTA) DR capsule 40 mg  40 mg Oral Daily Sanjuana Kava, NP   40 mg at 02/17/17 1610  . eszopiclone (LUNESTA) tablet 1 mg  1 mg Oral QHS Myrl Lazarus, MD      . ferrous sulfate tablet 325 mg  325 mg Oral Q breakfast Oneta Rack, NP   325 mg at 02/17/17 9604  . gabapentin (NEURONTIN) capsule 100 mg  100 mg Oral TID Sanjuana Kava, NP   100 mg at 02/17/17 0809  . guaiFENesin-dextromethorphan (ROBITUSSIN DM) 100-10 MG/5ML syrup 5 mL  5 mL Oral Q4H PRN Sanjuana Kava, NP      . ibuprofen (ADVIL,MOTRIN) tablet 200 mg  200 mg Oral Q4H PRN Sanjuana Kava, NP   200 mg at 02/16/17 1301  . LORazepam (ATIVAN) tablet 1 mg  1 mg Oral  Q6H PRN Jomarie Longs, MD   1 mg at 02/16/17 2120   Or  . LORazepam (ATIVAN) injection 1 mg  1 mg Intramuscular Q6H PRN Chris Cripps, MD      . magnesium hydroxide (MILK OF MAGNESIA) suspension 30 mL  30 mL Oral Daily PRN Sanjuana Kava, NP      . methocarbamol (ROBAXIN) tablet 500 mg  500 mg Oral Q8H PRN Barbarann Kelly, MD      . ondansetron (ZOFRAN) tablet 4 mg  4 mg Oral Q8H PRN Sanjuana Kava, NP      . pantoprazole (PROTONIX) EC tablet 40 mg  40 mg Oral Daily Sanjuana Kava, NP   40 mg at 02/17/17 0808  . phenytoin (DILANTIN) ER capsule 200 mg  200 mg Oral BID Sanjuana Kava, NP   200 mg at 02/17/17 0809  . QUEtiapine (SEROQUEL) tablet 450 mg  450 mg Oral QHS Jomarie Longs, MD      . saccharomyces boulardii (FLORASTOR) capsule 250 mg  250 mg Oral BID Jomarie Longs, MD   250 mg at 02/17/17 0809    Lab Results:  Results for orders placed or performed during the hospital encounter of 02/12/17 (from the past 48 hour(s))  Iron and TIBC     Status: None   Collection Time: 02/16/17  6:16 AM  Result Value Ref Range   Iron 51 28 - 170 ug/dL   TIBC 540 981 - 191 ug/dL   Saturation Ratios 17 10.4 - 31.8 %   UIBC 253 ug/dL    Comment: Performed at [facility], [address] [place], [place] [address]    Blood Alcohol level:  Lab Results  Component Value Date   [facility] <5 02/05/2017    Metabolic Disorder Labs: Lab Results  Component Value Date   HGBA1C 4.4 (L) 02/08/2017   MPG 80 02/08/2017   Lab Results  Component Value Date   PROLACTIN 23.6 (H) 02/08/2017   Lab Results  Component Value Date   CHOL 206 (H) 02/08/2017   TRIG 84 02/08/2017   HDL 63 02/08/2017   CHOLHDL 3.3 02/08/2017   VLDL 17 02/08/2017   LDLCALC 126 (H) 02/08/2017    Physical Findings: AIMS: Facial and Oral Movements Muscles of Facial Expression: None, normal Lips and Perioral Area: None, normal Jaw: None, normal Tongue: None, normal,Extremity Movements Upper (arms, wrists, hands, fingers):  None, normal Lower (legs, knees, ankles, toes): None, normal, Trunk Movements Neck, shoulders, hips: None, normal, Overall Severity Severity of abnormal movements (highest score from questions above): None, normal  Incapacitation due to abnormal movements: None, normal Patient's awareness of abnormal movements (rate only patient's report): No Awareness, Dental Status Current problems with teeth and/or dentures?: No Does patient usually wear dentures?: No  CIWA:    COWS:     Musculoskeletal: Strength & Muscle Tone: within normal limits Gait & Station: normal Patient leans: N/A  Psychiatric Specialty Exam: Physical Exam  Nursing note and vitals reviewed.   Review of Systems  Psychiatric/Behavioral: The patient is nervous/anxious and has insomnia.   All other systems reviewed and are negative.   Blood pressure 115/80, pulse (!) 110, temperature 98.5 F (36.9 C), temperature source Oral, resp. rate 18, height 5\' 2"  (1.575 m), weight 111.6 kg (246 lb), SpO2 99 %.Body mass index is 44.99 kg/m.  General Appearance: Casual and Fairly Groomed  Eye Contact:  Fair  Speech:  Clear and Coherent  Volume:  Normal  Mood:  Anxious  Affect:  Congruent  Thought Process:  Goal Directed and Descriptions of Associations: Intact  Orientation:  Full (Time, Place, and Person)  Thought Content:  Rumination  Suicidal Thoughts:  No  Homicidal Thoughts:  No  Memory:  Immediate;   Fair Recent;   Fair Remote;   Fair  Judgement:  Fair  Insight:  Fair  Psychomotor Activity:  Normal  Concentration:  Concentration: Fair and Attention Span: Fair  Recall:  FiservFair  Fund of Knowledge:  Fair  Language:  Fair  Akathisia:  No  Handed:  Right  AIMS (if indicated):     Assets:  Desire for Improvement Housing Resilience Social Support  ADL's:  Intact  Cognition:  WNL  Sleep:  Number of Hours: 6.25     Will continue today 02/17/17 plan as below except where it is noted.    Treatment Plan  Summary:Patient today continues to have sleep issues , as well as is dizzy - continue to make readjustments with medications. Daily contact with patient to assess and evaluate symptoms and progress in treatment, Medication management and Plan see below  Schizoaffective disorder, depressive type (HCC) continues to have sleep instability , ADRs to meds.   For affective sx:- reviewed - will [facility]. Cymbalta 40 mg po daily. Will increase seroquel to 450 mg po qhs for insomnia as well as augmenting the AD.   Anemia:  ferrous sulfate 325 mg PO BID for low CBC   For insomnia: Seroquel 450 mg po qhs . Add Lunesta 1 mg po qhs.  For anxiety sx: Ativan PRN as per MAR.   For recent SIRS 2/2 Influenza: ( resolved)   For seizure do :Phenytoin level - low - 12/17/17 - Results for Katherine Morse, Katherine Morse (MRN [id number]) as of 02/17/2017 12:19  Ref. Range 02/14/2017 06:17  Phenytoin, Free Unknown NOT DETECTED  Phenytoin, Total Unknown 1.3  [facility] now - pending hospitalist consult. Phenytoin per pharmacy consult.  For Essential HTN: Hold Metoprolol for hypotensive episodes .  For Sick euthyroid , fT4 wnl: Repeat TFT in 4 weeks .  For Normocytic anemia: Repeat hemoglobin as outpatient.  Will continue to monitor vitals ,medication compliance and treatment side effects while patient is here.   Will monitor for medical issues as well as call consult as needed.   Reviewed labs ,will order as noted above, EKG reviewed -NSR.  CSW will continue  working on disposition.   Patient to participate in therapeutic milieu .  Ranferi Clingan, MD 02/17/2017, 12:31 PM    Addendum: 02/17/17- 2:56 PM Spoke to  hospitalist - increase dilantin to 300 mg po bid. Next dilantin  level on 02/20/17.  Jomarie Longs ,MD Attending Psychiatrist  [facility]

## 2017-02-18 MED ORDER — QUETIAPINE FUMARATE 50 MG PO TABS: 50.0000 mg | ORAL_TABLET | Freq: Every day | ORAL | 0 refills | Status: AC

## 2017-02-18 MED ORDER — PHENYTOIN SODIUM EXTENDED 300 MG PO CAPS: 300.0000 mg | ORAL_CAPSULE | Freq: Two times a day (BID) | ORAL | 0 refills | Status: AC

## 2017-02-18 MED ORDER — PANTOPRAZOLE SODIUM 40 MG PO TBEC: 40.0000 mg | DELAYED_RELEASE_TABLET | Freq: Every day | ORAL | 0 refills | Status: AC

## 2017-02-18 MED ORDER — QUETIAPINE FUMARATE 400 MG PO TABS
400.0000 mg | ORAL_TABLET | Freq: Every day | ORAL | Status: DC
  Filled 2017-02-18 (×2): qty 1

## 2017-02-18 MED ORDER — ESZOPICLONE 1 MG PO TABS: 1.0000 mg | ORAL_TABLET | Freq: Every day | ORAL | 0 refills | Status: AC

## 2017-02-18 MED ORDER — DULOXETINE HCL 40 MG PO CPEP: 40.0000 mg | ORAL_CAPSULE | Freq: Every day | ORAL | 0 refills | Status: AC

## 2017-02-18 MED ORDER — GABAPENTIN 100 MG PO CAPS: 100.0000 mg | ORAL_CAPSULE | Freq: Three times a day (TID) | ORAL | 0 refills | Status: AC

## 2017-02-18 MED ORDER — QUETIAPINE FUMARATE 50 MG PO TABS
50.0000 mg | ORAL_TABLET | Freq: Every day | ORAL | Status: DC
  Filled 2017-02-18 (×2): qty 1

## 2017-02-18 MED ORDER — SACCHAROMYCES BOULARDII 250 MG PO CAPS: 250.0000 mg | ORAL_CAPSULE | Freq: Two times a day (BID) | ORAL | 0 refills | Status: AC

## 2017-02-18 MED ORDER — QUETIAPINE FUMARATE 400 MG PO TABS: 400.0000 mg | ORAL_TABLET | Freq: Every day | ORAL | 0 refills | Status: AC

## 2017-02-18 NOTE — Discharge Summary (Signed)
Physician Discharge Summary Note  Patient:  Katherine Morse is an 46 y.o., female MRN:  [id number] DOB:  [birth year] Patient phone:  [phone] (home)  Patient address:   [address] [place] [address],  Total Time spent with patient: 30 minutes  Date of Admission:  02/12/2017 Date of Discharge: 02/18/2017  Reason for Admission:  Worsening depression, developing into suicidal ideations.  Principal Problem: Schizoaffective disorder, depressive type [facility]) Discharge Diagnoses: Patient Active Problem List   Diagnosis Date Noted  . PTSD (post-traumatic stress disorder) [F43.10] 02/12/2017  . Sepsis (HCC) [A41.9] 02/08/2017  . Influenza A [J10.1] 02/08/2017  . Convulsions/seizures (HCC) [R56.9] 02/07/2017  . Essential hypertension [I10] 02/07/2017  . Schizoaffective disorder, depressive type (HCC) [F25.1] 02/06/2017    Past Psychiatric History:  See HPI  Past Medical History:  Past Medical History:  Diagnosis Date  . Anxiety   . Bipolar 1 disorder (HCC)   . Depression   . Hypertension   . Schizophrenia (HCC)   . Seizures (HCC)     Past Surgical History:  Procedure Laterality Date  . ABLATION    . CESAREAN SECTION     Family History:  Family History  Problem Relation Age of Onset  . Hypertension Mother   . Seizures Mother   . Mental illness Son   . Seizures Sister   . Alcoholism Cousin    Family Psychiatric  History: see HPI Social History:  History  Alcohol Use No     History  Drug Use No    Social History   Social History  . Marital status: Single    Spouse name: N/A  . Number of children: N/A  . Years of education: N/A   Social History Main Topics  . Smoking status: Former Games developer  . Smokeless tobacco: Never Used  . Alcohol use No  . Drug use: No  . Sexual activity: Not Asked   Other Topics Concern  . None   Social History Narrative  . None    Hospital Course:  Katherine Morse, 46 yo female, hx of schizoaffective disorder, presented  initially last week to [facility] with worsening SI and depression.  Upon arrival and shortly thereafter, patient was transferred to medical floor 2/2 influenza.  Once stabilized she was returned to [facility] to resume mental health and crisis management.  Patient also reported that Seroquel dose PTA, was not the correct dose, affected her ability to sleep.   Patient's crisis was brought on by the  death anniversary of her son and boyfriend.  This caused the patient to have insomnia and ruminate about the loss, further worsened her mood and became hopeless.  Katherine Morse was admitted for Schizoaffective disorder, depressive type (HCC), without psychosis and crisis management.  Daily contact with patient to assess and evaluate symptoms and progress in treatment, Medication management and plan of care adjusted to help better control symptoms/  Cymbalta 40 mg po daily for depression.  Seroquel was increased to 450 mg po qhs for insomnia as well as augmenting the anti depressant medication.  Since patient continued to have insomnia, Lunesta was added at 1 mg at bedtime.    Improvement was monitored by observation and Katherine Morse 's daily report of symptom reduction.  Emotional and mental status was monitored by daily self-inventory reports completed by Katherine Morse and clinical staff.     Upon completion of this admission the patient was both mentally and medically stable for discharge denying suicidal/homicidal ideation, auditory/visual/tactile hallucinations, delusional thoughts and  paranoia.    Physical Findings: AIMS: Facial and Oral Movements Muscles of Facial Expression: None, normal Lips and Perioral Area: None, normal Jaw: None, normal Tongue: None, normal,Extremity Movements Upper (arms, wrists, hands, fingers): None, normal Lower (legs, knees, ankles, toes): None, normal, Trunk Movements Neck, shoulders, hips: None, normal, Overall Severity Severity of abnormal movements (highest score from questions  above): None, normal Incapacitation due to abnormal movements: None, normal Patient's awareness of abnormal movements (rate only patient's report): No Awareness, Dental Status Current problems with teeth and/or dentures?: No Does patient usually wear dentures?: No  CIWA:    COWS:     Musculoskeletal: Strength & Muscle Tone: within normal limits Gait & Station: normal Patient leans: N/A  Psychiatric Specialty Exam: Physical Exam  Nursing note and vitals reviewed. Psychiatric: She has a normal mood and affect. Her speech is normal and behavior is normal. Judgment and thought content normal. Cognition and memory are normal.    Review of Systems  All other systems reviewed and are negative.   Blood pressure 112/77, pulse (!) 121, temperature 98.7 F (37.1 C), temperature source Oral, resp. rate 16, height 5\' 2"  (1.575 m), weight 111.6 kg (246 lb), SpO2 99 %.Body mass index is 44.99 kg/m.    Have you used any form of tobacco in the last 30 days? (Cigarettes, Smokeless Tobacco, Cigars, and/or Pipes): No  Has this patient used any form of tobacco in the last 30 days? (Cigarettes, Smokeless Tobacco, Cigars, and/or Pipes) Yes, N/A  Blood Alcohol level:  Lab Results  Component Value Date   ETH <5 02/05/2017    Metabolic Disorder Labs:  Lab Results  Component Value Date   HGBA1C 4.4 (L) 02/08/2017   MPG 80 02/08/2017   Lab Results  Component Value Date   PROLACTIN 23.6 (H) 02/08/2017   Lab Results  Component Value Date   CHOL 206 (H) 02/08/2017   TRIG 84 02/08/2017   HDL 63 02/08/2017   CHOLHDL 3.3 02/08/2017   VLDL 17 02/08/2017   LDLCALC 126 (H) 02/08/2017    See Psychiatric Specialty Exam and Suicide Risk Assessment completed by Attending Physician prior to discharge.  Discharge destination:  Home  Is patient on multiple antipsychotic therapies at discharge:  No   Has Patient had three or more failed trials of antipsychotic monotherapy by history:   No  Recommended Plan for Multiple Antipsychotic Therapies: NA   Allergies as of 02/18/2017      Reactions   Morphine    Other    Compazine [prochlorperazine Edisylate] Anxiety   Morphine And Related Anxiety   Penicillins Anxiety, Other (See Comments)   Has patient had a PCN reaction causing immediate rash, facial/tongue/throat swelling, SOB or lightheadedness with hypotension: No Has patient had a PCN reaction causing severe rash involving mucus membranes or skin necrosis: No  Has patient had a PCN reaction that required hospitalization: No  Has patient had a PCN reaction occurring within the last 10 years: No  If all of the above answers are "NO", then may proceed with Cephalosporin use.   Vistaril [hydroxyzine Hcl] Anxiety      Medication List    STOP taking these medications   guaiFENesin-dextromethorphan 100-10 MG/5ML syrup Commonly known as:  ROBITUSSIN DM   ibuprofen 200 MG tablet Commonly known as:  ADVIL,MOTRIN   omeprazole 20 MG capsule Commonly known as:  PRILOSEC Replaced by:  pantoprazole 40 MG tablet   ondansetron 4 MG tablet Commonly known as:  ZOFRAN   oseltamivir 75  MG capsule Commonly known as:  TAMIFLU     TAKE these medications     Indication  DULoxetine HCl 40 MG Cpep Take 40 mg by mouth daily. Start taking on:  02/19/2017  Indication:  Major Depressive Disorder   eszopiclone 1 MG Tabs tablet Commonly known as:  LUNESTA Take 1 tablet (1 mg total) by mouth at bedtime. Take immediately before bedtime  Indication:  Trouble Sleeping   gabapentin 100 MG capsule Commonly known as:  NEURONTIN Take 1 capsule (100 mg total) by mouth 3 (three) times daily.  Indication:  Agitation   pantoprazole 40 MG tablet Commonly known as:  PROTONIX Take 1 tablet (40 mg total) by mouth daily. Start taking on:  02/19/2017 Replaces:  omeprazole 20 MG capsule  Indication:  Gastroesophageal Reflux Disease   phenytoin 300 MG ER capsule Commonly known as:   DILANTIN Take 1 capsule (300 mg total) by mouth 2 (two) times daily. What changed:  medication strength  how much to take  Indication:  Seizure   QUEtiapine 400 MG tablet Commonly known as:  SEROQUEL Take 1 tablet (400 mg total) by mouth at bedtime. What changed:  medication strength  how much to take  Indication:  mood stabilization   QUEtiapine 50 MG tablet Commonly known as:  SEROQUEL Take 1 tablet (50 mg total) by mouth at bedtime. What changed:  You were already taking a medication with the same name, and this prescription was added. Make sure you understand how and when to take each.  Indication:  mood stabilization   saccharomyces boulardii 250 MG capsule Commonly known as:  FLORASTOR Take 1 capsule (250 mg total) by mouth 2 (two) times daily.  Indication:  GI health      Follow-up Information    [facility]. Go to.   Why:  Walk In Be sure to go within 7 days of your discharge Contact information: Address: [address], [place], [place] [address] Telephone: [phone] Fax: [phone]       [facility]. Go to.   Why:  Walk In.  these are folks who will help support you as you look for housing, and will make sure you have help getting to medical appointments.  Contact information: [address], [place], [place] [address]  Telephone: [phone] Fax: [phone]       [facility] Follow [facility].   Specialty:  Behavioral Health Why:  If you end up staying in [place] into March, please go to the walk-in clinic within the next 7 days for your hospital follow up appointment.  The will see you M-F between 8 and 10AM. Contact information: [address] [place] [phone]           Follow-up recommendations:  Activity:  as tol Diet:  as tol Tests:  Dilantin level must be taken before first dose in the morning of Feb 22.  Comments:  1.  Take all your medications as prescribed.   2.   Report any adverse side effects to outpatient provider. 3.  Patient instructed to not use alcohol or illegal drugs while on prescription medicines. 4.  In the event of worsening symptoms, instructed patient to call 911, the crisis hotline or go to nearest emergency room for evaluation of symptoms.  Signed: Lindwood QuaSheila May Kay Shippy, NP [facility] 02/18/2017, 10:28 AM

## 2017-02-18 NOTE — Progress Notes (Signed)
D: Pt d/c home as per order. Presents animated with bright affect and appeared to be in no physical distress at the time. Ambulatory with a steady gait. Accompanied to [facility] by security to collect her belongings (cards etc). Bus pass given for transportation home.  A: D/C instructions reviewed with pt including prescriptions and follow up appointments. Pt  All belongings in locker returned to pt prior to departure. Pt encouraged pt to comply with d/c instructions as discussed.  R: Pt receptive to care. Verbalized understanding related to d/c instructions. Signed belonging sheet in agreement with items received. Safety maintained on and off unit till time of d/c.

## 2017-02-18 NOTE — Progress Notes (Signed)
  [facility] :  Will you be returning to the same living situation after discharge:  Yes,  home At discharge, do you have transportation home?: Yes,  security to [facility] to get her debit card, then bus pass home from there. Do you have the ability to pay for your medications: Yes,  MCD  Release of information consent forms completed and in the chart;  Patient's signature needed at discharge.  Patient to Follow up at: Follow-up Information    [facility]. Go to.   Why:  Walk In Be sure to go within 7 days of your discharge Contact information: Address: [address], [place], [place] [address] Telephone: [phone] Fax: [phone]       [facility]. Go to.   Why:  Walk In.  these are folks who will help support you as you look for housing, and will make sure you have help getting to medical appointments.  Contact information: [address], [place], [place] [address]  Telephone: [phone] Fax: [phone]          Next level of care provider has access to [facility] Link:no  Safety Planning and Suicide Prevention discussed: Yes,  yes  Have you used any form of tobacco in the last 30 days? (Cigarettes, Smokeless Tobacco, Cigars, and/or Pipes): No  Has patient been referred to the Quitline?: N/A patient is not a smoker  Patient has been referred for addiction treatment: N/A  Katherine RogueRodney Morse Katherine Morse 02/18/2017, 9:11 AM

## 2017-02-18 NOTE — Tx Team (Signed)
Interdisciplinary Treatment and Diagnostic Plan Update  02/18/2017 Time of Session: 9:00 AM  Katherine Morse MRN: [id number]  Principal Diagnosis: Schizoaffective disorder, depressive type (HCC)  Secondary Diagnoses: Principal Problem:   Schizoaffective disorder, depressive type (HCC) Active Problems:   Convulsions/seizures (HCC)   Essential hypertension   Influenza A   PTSD (post-traumatic stress disorder)   Current Medications:  Current Facility-Administered Medications  Medication Dose Route Frequency Provider Last Rate Last Dose  . acetaminophen (TYLENOL) tablet 650 mg  650 mg Oral Q6H PRN Sanjuana Kava, NP      . albuterol (PROVENTIL) (2.5 MG/3ML) 0.083% nebulizer solution 2.5 mg  2.5 mg Nebulization Q4H PRN Jomarie Longs, MD   2.5 mg at 02/16/17 1301  . alum & mag hydroxide-simeth (MAALOX/MYLANTA) 200-200-20 MG/5ML suspension 30 mL  30 mL Oral Q4H PRN Sanjuana Kava, NP   30 mL at 02/12/17 1354  . DULoxetine (CYMBALTA) DR capsule 40 mg  40 mg Oral Daily Sanjuana Kava, NP   40 mg at 02/18/17 0748  . eszopiclone (LUNESTA) tablet 1 mg  1 mg Oral QHS Jomarie Longs, MD   1 mg at 02/17/17 2040  . ferrous sulfate tablet 325 mg  325 mg Oral Q breakfast Oneta Rack, NP   325 mg at 02/18/17 0748  . gabapentin (NEURONTIN) capsule 100 mg  100 mg Oral TID Sanjuana Kava, NP   100 mg at 02/18/17 0748  . guaiFENesin-dextromethorphan (ROBITUSSIN DM) 100-10 MG/5ML syrup 5 mL  5 mL Oral Q4H PRN Sanjuana Kava, NP      . ibuprofen (ADVIL,MOTRIN) tablet 200 mg  200 mg Oral Q4H PRN Sanjuana Kava, NP   200 mg at 02/17/17 2042  . LORazepam (ATIVAN) tablet 1 mg  1 mg Oral Q6H PRN Jomarie Longs, MD   1 mg at 02/16/17 2120   Or  . LORazepam (ATIVAN) injection 1 mg  1 mg Intramuscular Q6H PRN Saramma Eappen, MD      . magnesium hydroxide (MILK OF MAGNESIA) suspension 30 mL  30 mL Oral Daily PRN Sanjuana Kava, NP      . methocarbamol (ROBAXIN) tablet 500 mg  500 mg Oral Q8H PRN Jomarie Longs, MD   500 mg  at 02/17/17 1301  . ondansetron (ZOFRAN) tablet 4 mg  4 mg Oral Q8H PRN Sanjuana Kava, NP      . pantoprazole (PROTONIX) EC tablet 40 mg  40 mg Oral Daily Sanjuana Kava, NP   40 mg at 02/18/17 0748  . phenytoin (DILANTIN) ER capsule 300 mg  300 mg Oral BID Jomarie Longs, MD   300 mg at 02/18/17 0751  . QUEtiapine (SEROQUEL) tablet 450 mg  450 mg Oral QHS Jomarie Longs, MD   450 mg at 02/17/17 2041  . saccharomyces boulardii (FLORASTOR) capsule 250 mg  250 mg Oral BID Jomarie Longs, MD   250 mg at 02/18/17 0747    PTA Medications: Prescriptions Prior to Admission  Medication Sig Dispense Refill Last Dose  . DULoxetine 40 MG CPEP Take 40 mg by mouth daily. 30 capsule 0   . gabapentin (NEURONTIN) 100 MG capsule Take 1 capsule (100 mg total) by mouth 3 (three) times daily. 90 capsule 0   . guaiFENesin-dextromethorphan (ROBITUSSIN DM) 100-10 MG/5ML syrup Take 5 mLs by mouth every 4 (four) hours as needed for cough. 118 mL 0   . ibuprofen (ADVIL,MOTRIN) 200 MG tablet Take 1 tablet (200 mg total) by mouth every 4 (four) hours  as needed for fever, headache, mild pain, moderate pain or cramping. 30 tablet 0   . omeprazole (PRILOSEC) 20 MG capsule Take 20 mg by mouth daily.   Past Week at Unknown time  . ondansetron (ZOFRAN) 4 MG tablet Take 1 tablet (4 mg total) by mouth every 8 (eight) hours as needed for nausea or vomiting. 20 tablet 0   . oseltamivir (TAMIFLU) 75 MG capsule Take 1 capsule (75 mg total) by mouth 2 (two) times daily. 5 capsule 0   . phenytoin (DILANTIN) 100 MG ER capsule Take 2 capsules (200 mg total) by mouth 2 (two) times daily. 60 capsule 0   . QUEtiapine (SEROQUEL) 100 MG tablet Take 1 tablet (100 mg total) by mouth at bedtime. 30 tablet 0     Treatment Modalities: Medication Management, Group therapy, Case management,  1 to 1 session with clinician, Psychoeducation, Recreational therapy.   Physician Treatment Plan for Primary Diagnosis: Schizoaffective disorder, depressive  type (HCC) Long Term Goal(s): Improvement in symptoms so as ready for discharge  Short Term Goals: Ability to identify changes in lifestyle to reduce recurrence of condition will improve  Medication Management: Evaluate patient's response, side effects, and tolerance of medication regimen.  Therapeutic Interventions: 1 to 1 sessions, Unit Group sessions and Medication administration.  Evaluation of Outcomes: Adequate for Discharge  Physician Treatment Plan for Secondary Diagnosis: Principal Problem:   Schizoaffective disorder, depressive type (HCC) Active Problems:   Convulsions/seizures (HCC)   Essential hypertension   Influenza A   PTSD (post-traumatic stress disorder)   Long Term Goal(s): Improvement in symptoms so as ready for discharge  Short Term Goals: Compliance with prescribed medications will improve  Medication Management: Evaluate patient's response, side effects, and tolerance of medication regimen.  Therapeutic Interventions: 1 to 1 sessions, Unit Group sessions and Medication administration.  Evaluation of Outcomes: Adequate for Discharge   RN Treatment Plan for Primary Diagnosis: Schizoaffective disorder, depressive type (HCC) Long Term Goal(s): Knowledge of disease and therapeutic regimen to maintain health will improve  Short Term Goals: Ability to disclose and discuss suicidal ideas and Compliance with prescribed medications will improve  Medication Management: RN will administer medications as ordered by provider, will assess and evaluate patient's response and provide education to patient for prescribed medication. RN will report any adverse and/or side effects to prescribing provider.  Therapeutic Interventions: 1 on 1 counseling sessions, Psychoeducation, Medication administration, Evaluate responses to treatment, Monitor vital signs and CBGs as ordered, Perform/monitor CIWA, COWS, AIMS and Fall Risk screenings as ordered, Perform wound care treatments as  ordered.  Evaluation of Outcomes: Adequate for Discharge   Recreational Therapy Treatment Plan for Primary Diagnosis: Schizoaffective disorder, depressive type (HCC) Long Term Goal(s): LTG- Patient will participate in recreation therapy tx in at least 2 group sessions without prompting from LRT.  Short Term Goals:  Patient will be able to identify at least 5 coping skills for admitting dx by conclusion of recreation therapy tx.  Treatment Modalities: Group and Pet Therapy  Therapeutic Interventions: Psychoeducation  Evaluation of Outcomes: Adequate for Discharge   LCSW Treatment Plan for Primary Diagnosis: Schizoaffective disorder, depressive type (HCC) Long Term Goal(s): Safe transition to appropriate next level of care at discharge, Engage patient in therapeutic group addressing interpersonal concerns.  Short Term Goals: Engage patient in aftercare planning with referrals and resources and Increase skills for wellness and recovery  Therapeutic Interventions: Assess for all discharge needs, 1 to 1 time with Social worker, Explore available resources and support systems,  Assess for adequacy in community support network, Educate family and significant other(s) on suicide prevention, Complete Psychosocial Assessment, Interpersonal group therapy.  Evaluation of Outcomes: Adequate for Discharge   Progress in Treatment: Attending groups: Yes Participating in groups: Yes Taking medication as prescribed: Yes, MD continues to assess for medication changes as needed Toleration medication: Yes, no side effects reported at this time Family/Significant other contact made: No, patient stated there is no one to contact.  Patient understands diagnosis: Limited insight  Discussing patient identified problems/goals with staff: Yes Medical problems stabilized or resolved: Yes Denies suicidal/homicidal ideation: Yes  Issues/concerns per patient self-inventory: None Other: N/A  New problem(s)  identified: None identified at this time.   New Short Term/Long Term Goal(s): None identified at this time.   Discharge Plan or Barriers[place] and [facility]  In the meantime, will stay with friend [place] in [place].  Reason for Continuation of Hospitalization:   Estimated Length of Stay: D/C today  Attendees: Patient: 02/18/2017  9:00 AM  Physician: Dr. Elna BreslowEappen 02/18/2017  9:00 AM  Nursing: Lanora ManisElizabeth.I, RN  02/18/2017  9:00 AM  RN Care Manager: Onnie BoerJennifer Clark 02/18/2017  9:00 AM  Social Worker: Richelle Itood Jaslene Marsteller, LCSW 02/18/2017  9:00 AM  Recreational Therapist: Aggie CosierMarjette Lindsey 02/18/2017  9:00 AM  Other: Baldo DaubJolan Williams, Social Work Intern  02/18/2017  9:00 AM  Other:  02/18/2017  9:00 AM  Other: 02/18/2017  9:00 AM    Scribe for Treatment Team: Richelle Itood Micheline Markes, LCSW,  Social Work Intern 02/18/2017 9:00 AM

## 2017-02-18 NOTE — Plan of Care (Signed)
Problem: [facility] Participation in Recreation Therapeutic Interventions Goal: STG-Patient will identify at least five coping skills for ** STG: Coping Skills - Patient will be able to identify at least 5 coping skills for depression by conclusion of recreation therapy tx  Outcome: Completed/Met Date Met: 02/18/17 Pt was able to identify coping skills at completion of coping skills recreation therapy sessions.   , LRT/CTRS   

## 2017-02-18 NOTE — Progress Notes (Signed)
D. Milka had been up and visible in milieu this evening, attended and participated in evening activities. Pammy spoke about how she has not been sleeping well and spoke about some medication changes in hopes of being able to sleep better. Sarahgrace did receive all bedtime medications without incident. A. Support provided, medication education given. R. Leinaala verbalized understanding, safety maintained.

## 2017-02-18 NOTE — [facility] Suicide Risk Assessment (Signed)
[facility]   Principal Problem: Schizoaffective disorder, depressive type [facility](HCC) Discharge Diagnoses:  Patient Active Problem List   Diagnosis Date Noted  . PTSD (post-traumatic stress disorder) [F43.10] 02/12/2017  . Sepsis (HCC) [A41.9] 02/08/2017  . Influenza A [J10.1] 02/08/2017  . Convulsions/seizures (HCC) [R56.9] 02/07/2017  . Essential hypertension [I10] 02/07/2017  . Schizoaffective disorder, depressive type (HCC) [F25.1] 02/06/2017    Total Time spent with patient: 30 minutes  Musculoskeletal: Strength & Muscle Tone: within normal limits Gait & Station: normal Patient leans: N/A  Psychiatric Specialty Exam: Review of Systems  All other systems reviewed and are negative.   Blood pressure 112/77, pulse (!) 121, temperature 98.7 F (37.1 C), temperature source Oral, resp. rate 16, height 5\' 2"  (1.575 m), weight 111.6 kg (246 lb), SpO2 99 %.Body mass index is 44.99 kg/m.  General Appearance: Casual  Eye Contact::  Fair  Speech:  Normal Rate409  Volume:  Normal  Mood:  Euthymic  Affect:  Appropriate  Thought Process:  Goal Directed and Descriptions of Associations: Intact  Orientation:  Full (Time, Place, and Person)  Thought Content:  Logical  Suicidal Thoughts:  No  Homicidal Thoughts:  No  Memory:  Immediate;   Fair Recent;   Fair Remote;   Fair  Judgement:  Fair  Insight:  Fair  Psychomotor Activity:  Normal  Concentration:  Fair  Recall:  FiservFair  Fund of Knowledge:Fair  Language: Fair  Akathisia:  No  Handed:  Right  AIMS (if indicated):   0  Assets:  Desire for Improvement  Sleep:  Number of Hours: 6.75  Cognition: WNL  ADL's:  Intact   Mental Status Per Nursing Assessment::   On Admission:     Demographic Factors:  Caucasian  Loss Factors: NA  Historical Factors: Impulsivity  Risk Reduction Factors:   Positive social support and Positive therapeutic relationship  Continued Clinical Symptoms:  Previous  Psychiatric Diagnoses and Treatments Medical Diagnoses and Treatments/Surgeries  Cognitive Features That Contribute To Risk:  None    Suicide Risk:  Minimal: No identifiable suicidal ideation.  Patients presenting with no risk factors but with morbid ruminations; may be classified as minimal risk based on the severity of the depressive symptoms  Follow-up Information    [facility]. Go to.   Why:  Walk In Be sure to go within 7 days of your discharge Contact information: Address: [address], [place], [place] [address] Telephone: [phone] Fax: [phone]       [facility]. Go to.   Why:  Walk In.  these are folks who will help support you as you look for housing, and will make sure you have help getting to medical appointments.  Contact information: [address], [place], [place] [address]  Telephone: [phone] Fax: [phone]          Plan Of Care/Follow-up recommendations:  Activity:  no restrictions Diet:  regular Tests:  Dilantin level to be done on 02/20/17 prior to AM dose of dilantin , discussed this with pt. Pleas go to [facility] as discussed. Other:  none  Citlali Gautney, MD 02/18/2017, 9:17 AM

## 2017-02-18 NOTE — Progress Notes (Signed)
Recreation Therapy Notes  Date: 02/18/17 Time: 1000 Location: 500 Hall Dayroom  Group Topic: Coping Skills  Goal Area(s) Addresses:  Patients will be able to identify positive coping skills. Patients will be able to identify benefits of using coping skills post d/c.  Behavioral Response: Engaged  Intervention: Worksheet, pencils, dry erase marker, dry erase board  Activity: Mindmap.  Patients were given a worksheet of a a blank mind map.  As a group, patients filled in the first 8 boxes with triggers that require coping skills.  Patients were to then come up with 3 coping skills for each trigger identified by the group.  Once patients identified their coping skills, the group would fill in the remainder of the map on the board.  Education: PharmacologistCoping Skills, Building control surveyorDischarge Planning.   Education Outcome: Acknowledges understanding/In group clarification offered/Needs additional education.   Clinical Observations/Feedback: Pt stated coping skills allow you not to "let people get on your nerves."  Pt also stated using bad coping skills puts you in bad situations.  Some of the coping skills pt identified in group are spending time with family, walking away, deal with things head on and change how you do things.    Caroll RancherMarjette Daisy Morse, LRT/CTRS        Caroll RancherLindsay, Katherine Morse A 02/18/2017 11:51 AM

## 2017-02-19 ENCOUNTER — Encounter (HOSPITAL_COMMUNITY): Payer: Self-pay | Admitting: Emergency Medicine

## 2017-02-19 ENCOUNTER — Emergency Department (HOSPITAL_COMMUNITY)
Admission: EM | Admit: 2017-02-19 | Discharge: 2017-02-19 | Disposition: A | Discharge status: Home / Self Care | Payer: Medicaid Other | Attending: Emergency Medicine | Admitting: Emergency Medicine

## 2017-02-19 DIAGNOSIS — Z87891 Personal history of nicotine dependence: Secondary | ICD-10-CM | POA: Diagnosis not present

## 2017-02-19 DIAGNOSIS — G40909 Epilepsy, unspecified, not intractable, without status epilepticus: Secondary | ICD-10-CM | POA: Insufficient documentation

## 2017-02-19 DIAGNOSIS — I10 Essential (primary) hypertension: Secondary | ICD-10-CM | POA: Insufficient documentation

## 2017-02-19 DIAGNOSIS — R569 Unspecified convulsions: Secondary | ICD-10-CM

## 2017-02-19 LAB — PHENYTOIN LEVEL, TOTAL: PHENYTOIN LVL: 6.4 ug/mL — AB (ref 10.0–20.0)

## 2017-02-19 MED ORDER — PHENYTOIN SODIUM 50 MG/ML IJ SOLN
1000.0000 mg | Freq: Once | INTRAMUSCULAR | Status: AC
  Administered 2017-02-19: 1000 mg via INTRAVENOUS
  Filled 2017-02-19: qty 20

## 2017-02-19 NOTE — ED Notes (Signed)
Bed: WA06 Expected date:  Expected time:  Means of arrival:  Comments: seizure 

## 2017-02-19 NOTE — ED Provider Notes (Signed)
[facility]-EMERGENCY DEPT Provider Note   CSN: [id number] Arrival date & time: 02/19/17  1614     History   Chief Complaint Chief Complaint  Patient presents with  . Seizures    HPI Katherine Morse is a 46 y.o. female. CC: Seizure  HPI:  Patient presents for evaluation after 2 seizures at home today. She has a history of seizure disorder. Just recently discharged 2 days ago from behavioral health. Has not filled her Dilantin since leaving there. With her level was low while she was admitted there. With malingering or seizures. Did not bite her tongue. She has no teeth. No incontinence. No fall or injury or strike to the head. Alert and back to baseline  Past Medical History:  Diagnosis Date  . Anxiety   . Bipolar 1 disorder (HCC)   . Depression   . Hypertension   . Schizophrenia (HCC)   . Seizures [facility](HCC)     Patient Active Problem List   Diagnosis Date Noted  . PTSD (post-traumatic stress disorder) 02/12/2017  . Sepsis (HCC) 02/08/2017  . Influenza A 02/08/2017  . Convulsions/seizures (HCC) 02/07/2017  . Essential hypertension 02/07/2017  . Schizoaffective disorder, depressive type (HCC) 02/06/2017    Past Surgical History:  Procedure Laterality Date  . ABLATION    . CESAREAN SECTION      OB History    No data available       Home Medications    Prior to Admission medications   Medication Sig Start Date End Date Taking? Authorizing Provider  DULoxetine 40 MG CPEP Take 40 mg by mouth daily. 02/19/17  Yes Adonis BrookSheila Agustin, NP  eszopiclone (LUNESTA) 1 MG TABS tablet Take 1 tablet (1 mg total) by mouth at bedtime. Take immediately before bedtime 02/18/17  Yes Adonis BrookSheila Agustin, NP  gabapentin (NEURONTIN) 100 MG capsule Take 1 capsule (100 mg total) by mouth 3 (three) times daily. 02/18/17  Yes Adonis BrookSheila Agustin, NP  pantoprazole (PROTONIX) 40 MG tablet Take 1 tablet (40 mg total) by mouth daily. 02/19/17  Yes Adonis BrookSheila Agustin, NP  phenytoin (DILANTIN) 300 MG ER capsule Take 1 capsule  (300 mg total) by mouth 2 (two) times daily. 02/18/17  Yes Adonis BrookSheila Agustin, NP  QUEtiapine (SEROQUEL) 400 MG tablet Take 1 tablet (400 mg total) by mouth at bedtime. 02/18/17  Yes Adonis BrookSheila Agustin, NP  QUEtiapine (SEROQUEL) 50 MG tablet Take 1 tablet (50 mg total) by mouth at bedtime. 02/18/17  Yes Adonis BrookSheila Agustin, NP  saccharomyces boulardii (FLORASTOR) 250 MG capsule Take 1 capsule (250 mg total) by mouth 2 (two) times daily. 02/18/17  Yes Adonis BrookSheila Agustin, NP    Family History Family History  Problem Relation Age of Onset  . Hypertension Mother   . Seizures Mother   . Mental illness Son   . Seizures Sister   . Alcoholism Cousin     Social History Social History  Substance Use Topics  . Smoking status: Former Games developermoker  . Smokeless tobacco: Never Used  . Alcohol use No     Allergies   Morphine; Other; Compazine [prochlorperazine edisylate]; Morphine and related; Penicillins; and Vistaril [hydroxyzine hcl]   Review of Systems Review of Systems  Constitutional: Negative for appetite change, chills, diaphoresis, fatigue and fever.  HENT: Negative for mouth sores, sore throat and trouble swallowing.   Eyes: Negative for visual disturbance.  Respiratory: Negative for cough, chest tightness, shortness of breath and wheezing.   Cardiovascular: Negative for chest pain.  Gastrointestinal: Negative for abdominal distention, abdominal pain, diarrhea, nausea  and vomiting.  Endocrine: Negative for polydipsia, polyphagia and polyuria.  Genitourinary: Negative for dysuria, frequency and hematuria.  Musculoskeletal: Negative for gait problem.  Skin: Negative for color change, pallor and rash.  Neurological: Positive for seizures. Negative for dizziness, syncope, light-headedness and headaches.  Hematological: Does not bruise/bleed easily.  Psychiatric/Behavioral: Negative for behavioral problems and confusion.     Physical Exam Updated Vital Signs BP 109/75 (BP Location: Right Arm)   Pulse 81    Resp 12   Ht 5\' 2"  (1.575 m)   Wt 246 lb (111.6 kg)   SpO2 98%   BMI 44.99 kg/m   Physical Exam  Constitutional: She is oriented to person, place, and time. She appears well-developed and well-nourished. No distress.  HENT:  Head: Normocephalic.  Edentulous  Eyes: Conjunctivae are normal. Pupils are equal, round, and reactive to light. No scleral icterus.  Neck: Normal range of motion. Neck supple. No thyromegaly present.  Cardiovascular: Normal rate and regular rhythm.  Exam reveals no gallop and no friction rub.   No murmur heard. Pulmonary/Chest: Effort normal and breath sounds normal. No respiratory distress. She has no wheezes. She has no rales.  Abdominal: Soft. Bowel sounds are normal. She exhibits no distension. There is no tenderness. There is no rebound.  Musculoskeletal: Normal range of motion.  Neurological: She is alert and oriented to person, place, and time.  Skin: Skin is warm and dry. No rash noted.  Psychiatric: She has a normal mood and affect. Her behavior is normal.     ED Treatments / Results  Labs (all labs ordered are listed, but only abnormal results are displayed) Labs Reviewed  PHENYTOIN LEVEL, TOTAL - Abnormal; Notable for the following:       Result Value   Phenytoin Lvl 6.4 (*)    All other components within normal limits    EKG  EKG Interpretation None       Radiology No results found.  Procedures Procedures (including critical care time)  Medications Ordered in ED Medications  phenytoin (DILANTIN) 1,000 mg in sodium chloride 0.9 % 250 mL IVPB (1,000 mg Intravenous New Bag/Given 02/19/17 1807)     Initial Impression / Assessment and Plan / ED Course  I have reviewed the triage vital signs and the nursing notes.  Pertinent labs & imaging results that were available during my care of the patient were reviewed by me and considered in my medical decision making (see chart for details).     We'll give IV Dilantin. Level pending.  Had recent normal liver and kidney function testing. Likely discharge after Dilantin load.  Final Clinical Impressions(s) / ED Diagnoses   Final diagnoses:  Seizure (HCC)   Dilantin level 6.4.  Given 1 g IV Dilantin. This should put her into therapeutic range. Encouraged to fill prescription for Dilantin and he can tomorrow morning. She states that he has means to fill the prescription and intends to tonight  New Prescriptions New Prescriptions   No medications on file     Rolland Porter, MD 02/19/17 1902

## 2017-02-19 NOTE — ED Triage Notes (Signed)
Per EMS, pt has been missing doses of dilantin and has had 2 seizures today. Pt still has prescriptions from her last visit in her possession and has not had them filled. Pt is alert and oriented and speaking in full sentences and does not exhibit signs of being post-ictal.

## 2017-02-19 NOTE — Discharge Instructions (Signed)
Do not skip/stop your Dilantin. Fill your prescription tonight and start taking in the morning.

## 2017-05-27 IMAGING — CR DG CHEST 2V
2 series · 2 of 2 positions shown · non-contrast
Comparison: Chest radiograph performed 09/01/2016

CLINICAL DATA: Acute onset of hypotension. Positive for influenza.
Initial encounter.

EXAM:
CHEST  2 VIEW

[w chest lat]
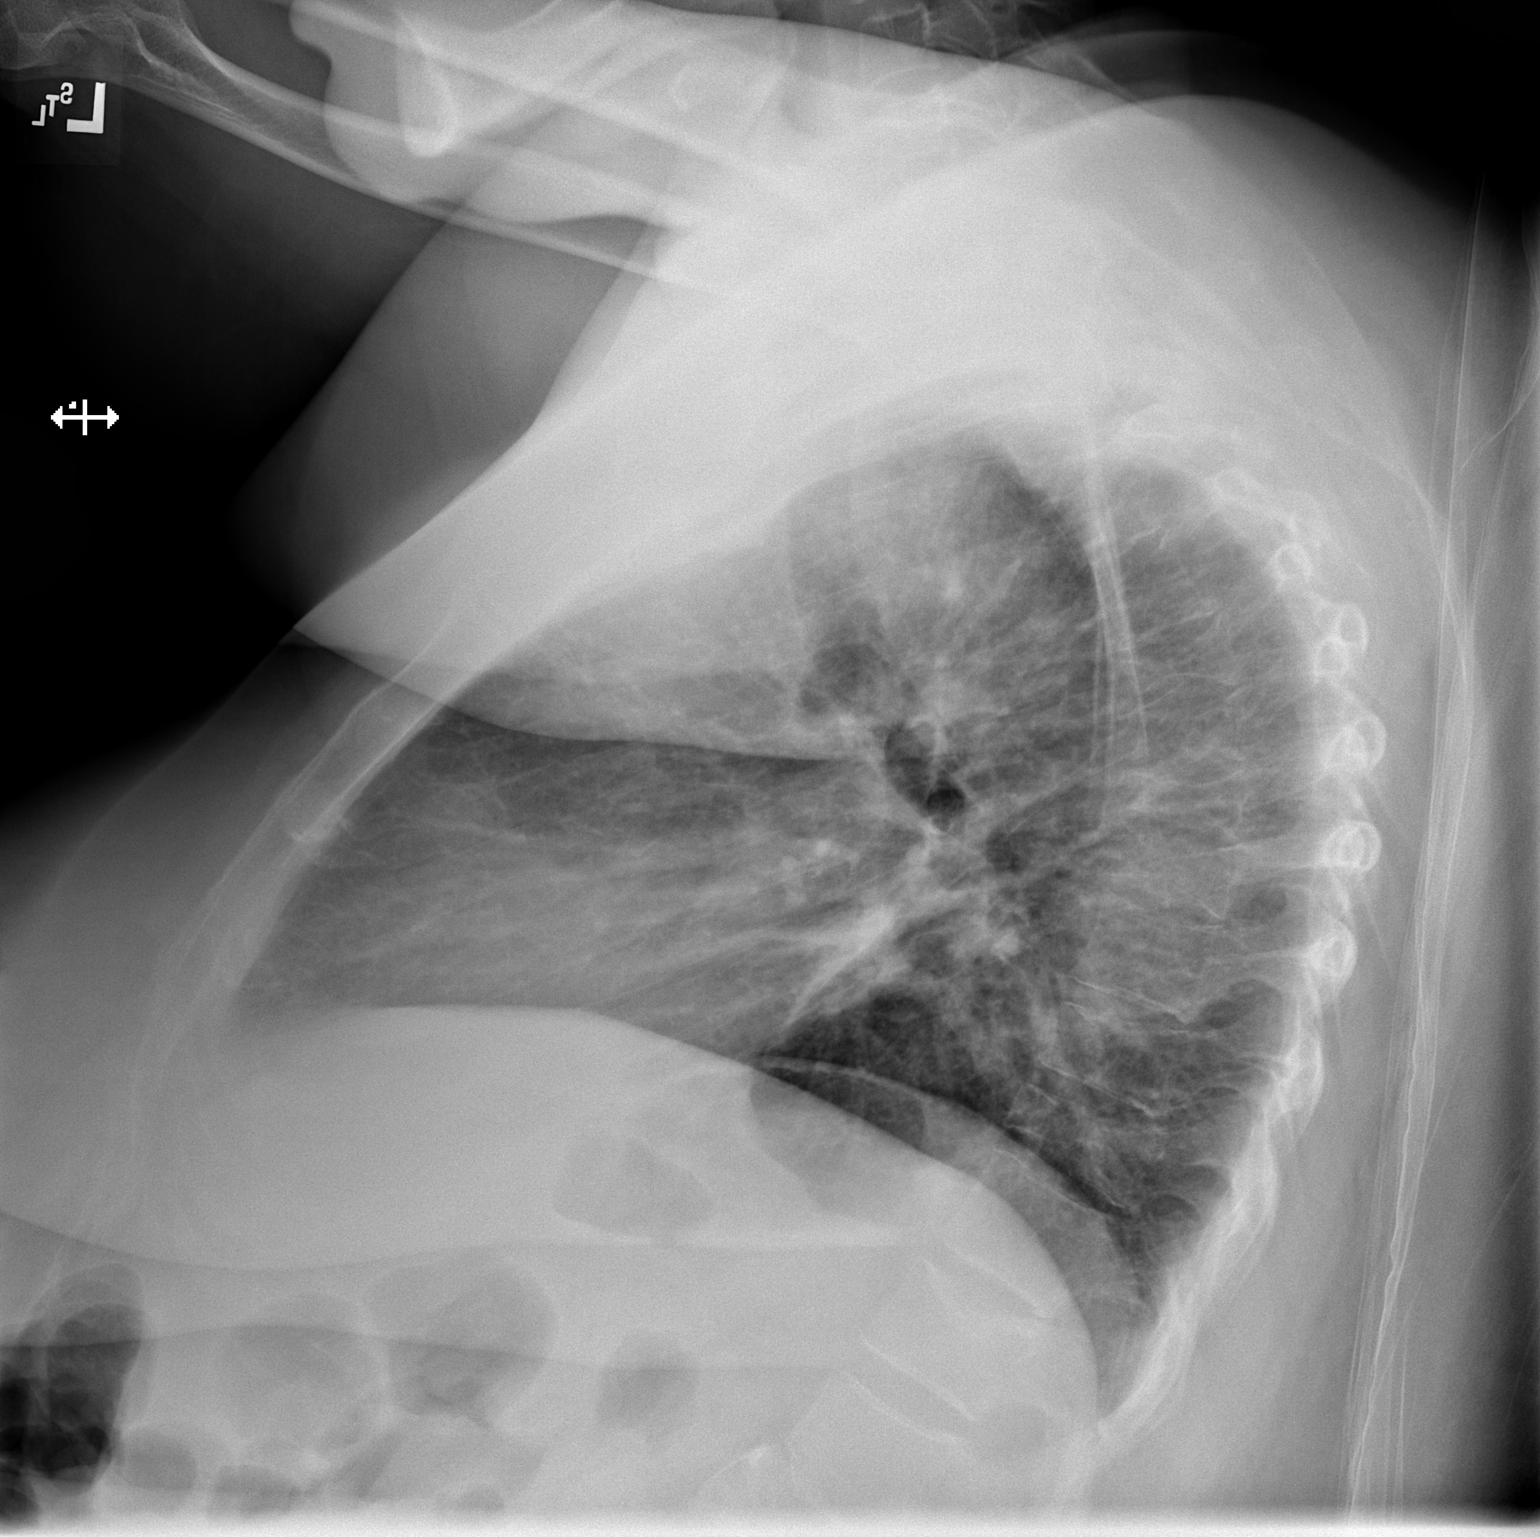

[x chest ap]
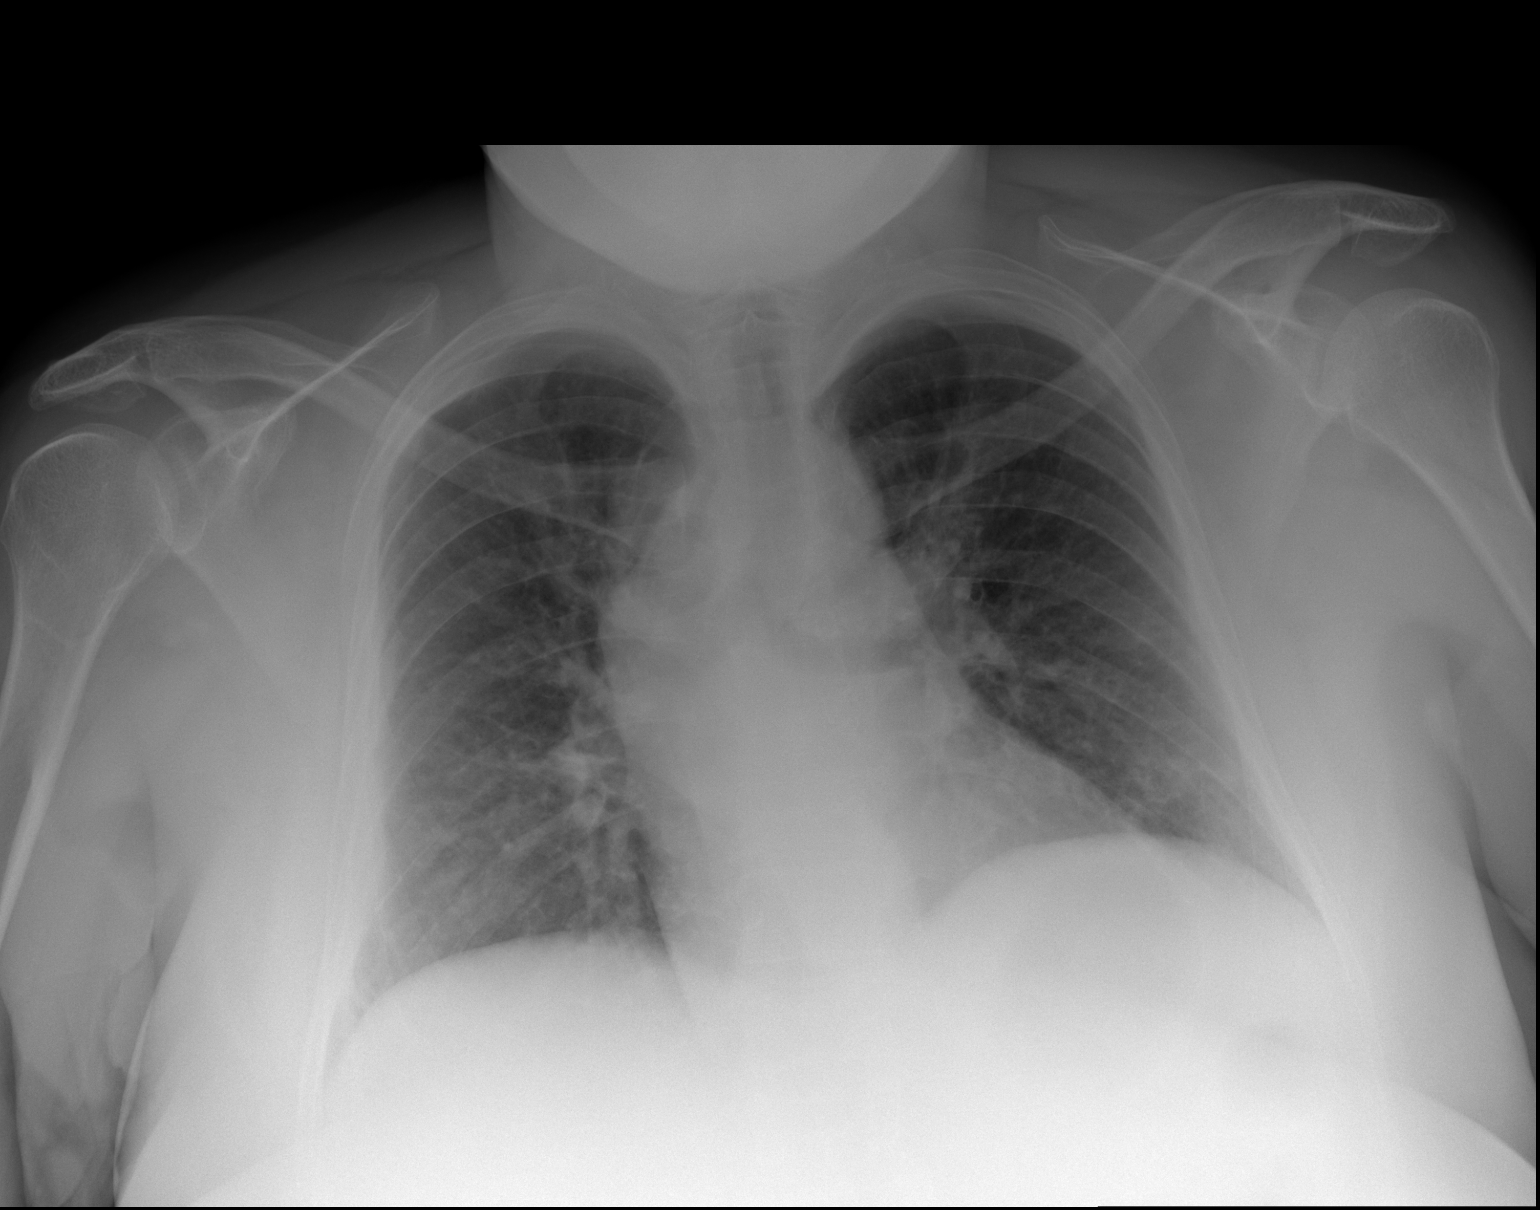

[2 of 2 positions shown; findings below may reference images not displayed]

FINDINGS: The lungs are well-aerated. Pulmonary vascularity is at the upper
limits of normal. There is no evidence of focal opacification,
pleural effusion or pneumothorax.

The heart is borderline normal in size. There is apparent tortuosity
of the thoracic aorta. No acute osseous abnormalities are seen. Mild
chronic compression deformities are again noted along the lower
thoracic spine.
IMPRESSION: No acute cardiopulmonary process seen.
